# Patient Record
Sex: Female | Born: 1990 | Race: White | Hispanic: No | Marital: Married | Smoking: Never smoker
Health system: Southern US, Community
[De-identification: ages and names within clinical notes are randomized; demographics above are authoritative.]

## PROBLEM LIST (undated history)

## (undated) DIAGNOSIS — J45909 Unspecified asthma, uncomplicated: Secondary | ICD-10-CM

## (undated) DIAGNOSIS — F909 Attention-deficit hyperactivity disorder, unspecified type: Secondary | ICD-10-CM

---

## 2011-02-10 ENCOUNTER — Emergency Department: Payer: Self-pay | Admitting: Emergency Medicine

## 2012-01-22 ENCOUNTER — Emergency Department: Payer: Self-pay | Admitting: *Deleted

## 2012-05-23 ENCOUNTER — Emergency Department: Payer: Self-pay | Admitting: Internal Medicine

## 2012-05-30 ENCOUNTER — Emergency Department: Payer: Self-pay | Admitting: Emergency Medicine

## 2012-06-02 ENCOUNTER — Emergency Department: Payer: Self-pay | Admitting: Emergency Medicine

## 2012-09-17 ENCOUNTER — Emergency Department: Payer: Self-pay | Admitting: Emergency Medicine

## 2012-09-18 LAB — RAPID INFLUENZA A&B ANTIGENS

## 2012-09-20 LAB — BETA STREP CULTURE(ARMC)

## 2012-11-08 ENCOUNTER — Emergency Department: Payer: Self-pay | Admitting: Emergency Medicine

## 2012-11-21 ENCOUNTER — Observation Stay: Payer: Self-pay | Admitting: Obstetrics and Gynecology

## 2012-11-21 LAB — URINALYSIS, COMPLETE
Bilirubin,UR: NEGATIVE
Ketone: NEGATIVE
Nitrite: NEGATIVE
Ph: 7 (ref 4.5–8.0)
Protein: 100
RBC,UR: 2710 /HPF (ref 0–5)
Specific Gravity: 1.02 (ref 1.003–1.030)
Squamous Epithelial: 6

## 2012-11-22 LAB — WET PREP, GENITAL

## 2012-11-24 LAB — URINE CULTURE

## 2012-12-10 ENCOUNTER — Observation Stay: Payer: Self-pay | Admitting: Obstetrics and Gynecology

## 2012-12-10 LAB — CBC WITH DIFFERENTIAL/PLATELET
Basophil #: 0 10*3/uL (ref 0.0–0.1)
Basophil %: 0.3 %
Eosinophil #: 0.2 10*3/uL (ref 0.0–0.7)
Eosinophil %: 1.9 %
HGB: 9.4 g/dL — ABNORMAL LOW (ref 12.0–16.0)
Lymphocyte #: 1.9 10*3/uL (ref 1.0–3.6)
Lymphocyte %: 19.8 %
MCH: 25.3 pg — ABNORMAL LOW (ref 26.0–34.0)
Monocyte %: 8.2 %
Neutrophil #: 6.6 10*3/uL — ABNORMAL HIGH (ref 1.4–6.5)
Neutrophil %: 69.8 %
Platelet: 185 10*3/uL (ref 150–440)
RBC: 3.72 10*6/uL — ABNORMAL LOW (ref 3.80–5.20)
RDW: 13.4 % (ref 11.5–14.5)

## 2012-12-10 LAB — COMPREHENSIVE METABOLIC PANEL
Albumin: 2.4 g/dL — ABNORMAL LOW (ref 3.4–5.0)
Alkaline Phosphatase: 106 U/L (ref 50–136)
Anion Gap: 8 (ref 7–16)
Bilirubin,Total: 0.2 mg/dL (ref 0.2–1.0)
Chloride: 112 mmol/L — ABNORMAL HIGH (ref 98–107)
Co2: 21 mmol/L (ref 21–32)
Creatinine: 0.77 mg/dL (ref 0.60–1.30)
EGFR (African American): >60
Glucose: 85 mg/dL (ref 65–99)
Osmolality: 278 (ref 275–301)
Potassium: 3.4 mmol/L — ABNORMAL LOW (ref 3.5–5.1)
Sodium: 141 mmol/L (ref 136–145)
Total Protein: 6 g/dL — ABNORMAL LOW (ref 6.4–8.2)

## 2012-12-10 LAB — URINALYSIS, COMPLETE
Glucose,UR: NEGATIVE mg/dL (ref 0–75)
Ketone: NEGATIVE
Nitrite: NEGATIVE
Ph: 7 (ref 4.5–8.0)
RBC,UR: 1 /HPF (ref 0–5)
Specific Gravity: 1.02 (ref 1.003–1.030)
Squamous Epithelial: 27
WBC UR: 7 /HPF (ref 0–5)

## 2013-02-01 ENCOUNTER — Observation Stay: Payer: Self-pay | Admitting: Obstetrics and Gynecology

## 2013-02-01 LAB — URINALYSIS, COMPLETE
Bilirubin,UR: NEGATIVE
Blood: NEGATIVE
Glucose,UR: NEGATIVE mg/dL (ref 0–75)
Nitrite: NEGATIVE
Ph: 7 (ref 4.5–8.0)
Protein: NEGATIVE
RBC,UR: 1 /HPF (ref 0–5)
Specific Gravity: 1.009 (ref 1.003–1.030)

## 2013-02-19 ENCOUNTER — Observation Stay: Payer: Self-pay | Admitting: Obstetrics and Gynecology

## 2013-02-21 ENCOUNTER — Observation Stay: Payer: Self-pay

## 2013-02-22 ENCOUNTER — Inpatient Hospital Stay: Payer: Self-pay | Admitting: Obstetrics & Gynecology

## 2013-02-22 LAB — CBC WITH DIFFERENTIAL/PLATELET
Basophil #: 0.1 10*3/uL (ref 0.0–0.1)
Eosinophil #: 0.1 10*3/uL (ref 0.0–0.7)
HCT: 31.1 % — ABNORMAL LOW (ref 35.0–47.0)
HGB: 10.1 g/dL — ABNORMAL LOW (ref 12.0–16.0)
Lymphocyte %: 22 %
MCHC: 32.4 g/dL (ref 32.0–36.0)
MCV: 70 fL — ABNORMAL LOW (ref 80–100)
Monocyte #: 0.6 x10 3/mm (ref 0.2–0.9)
RBC: 4.45 10*6/uL (ref 3.80–5.20)
WBC: 8.9 10*3/uL (ref 3.6–11.0)

## 2013-02-22 LAB — GC/CHLAMYDIA PROBE AMP

## 2014-01-03 ENCOUNTER — Emergency Department: Payer: Self-pay | Admitting: Emergency Medicine

## 2014-01-03 LAB — CBC
HCT: 38.8 % (ref 35.0–47.0)
HGB: 12.5 g/dL (ref 12.0–16.0)
MCH: 23.8 pg — AB (ref 26.0–34.0)
MCHC: 32.1 g/dL (ref 32.0–36.0)
MCV: 74 fL — ABNORMAL LOW (ref 80–100)
Platelet: 265 10*3/uL (ref 150–440)
RBC: 5.24 10*6/uL — ABNORMAL HIGH (ref 3.80–5.20)
RDW: 16.7 % — AB (ref 11.5–14.5)
WBC: 13.1 10*3/uL — ABNORMAL HIGH (ref 3.6–11.0)

## 2014-01-03 LAB — COMPREHENSIVE METABOLIC PANEL
ALBUMIN: 3.6 g/dL (ref 3.4–5.0)
AST: 23 U/L (ref 15–37)
Alkaline Phosphatase: 88 U/L
Anion Gap: 8 (ref 7–16)
BUN: 13 mg/dL (ref 7–18)
Bilirubin,Total: 0.3 mg/dL (ref 0.2–1.0)
CHLORIDE: 109 mmol/L — AB (ref 98–107)
Calcium, Total: 9.1 mg/dL (ref 8.5–10.1)
Co2: 21 mmol/L (ref 21–32)
Creatinine: 0.63 mg/dL (ref 0.60–1.30)
EGFR (Non-African Amer.): >60
GLUCOSE: 84 mg/dL (ref 65–99)
Osmolality: 275 (ref 275–301)
Potassium: 3.6 mmol/L (ref 3.5–5.1)
SGPT (ALT): 23 U/L (ref 12–78)
Sodium: 138 mmol/L (ref 136–145)
TOTAL PROTEIN: 7.3 g/dL (ref 6.4–8.2)

## 2014-01-03 LAB — URINALYSIS, COMPLETE
BILIRUBIN, UR: NEGATIVE
Bacteria: NONE SEEN
GLUCOSE, UR: NEGATIVE mg/dL (ref 0–75)
Ketone: NEGATIVE
NITRITE: NEGATIVE
PH: 6 (ref 4.5–8.0)
SPECIFIC GRAVITY: 1.017 (ref 1.003–1.030)
Squamous Epithelial: 1
WBC UR: 426 /HPF (ref 0–5)

## 2014-01-03 LAB — HCG, QUANTITATIVE, PREGNANCY: BETA HCG, QUANT.: 11623 m[IU]/mL — AB

## 2014-01-04 LAB — WET PREP, GENITAL

## 2014-01-04 LAB — GC/CHLAMYDIA PROBE AMP

## 2014-01-06 LAB — URINE CULTURE

## 2014-02-03 ENCOUNTER — Emergency Department: Payer: Self-pay | Admitting: Emergency Medicine

## 2014-02-03 LAB — CBC
HCT: 36.5 % (ref 35.0–47.0)
HGB: 11.7 g/dL — ABNORMAL LOW (ref 12.0–16.0)
MCH: 24.2 pg — AB (ref 26.0–34.0)
MCHC: 32.1 g/dL (ref 32.0–36.0)
MCV: 75 fL — ABNORMAL LOW (ref 80–100)
Platelet: 214 10*3/uL (ref 150–440)
RBC: 4.84 10*6/uL (ref 3.80–5.20)
RDW: 15.7 % — AB (ref 11.5–14.5)
WBC: 9.4 10*3/uL (ref 3.6–11.0)

## 2014-02-03 LAB — URINALYSIS, COMPLETE
BACTERIA: NONE SEEN
BILIRUBIN, UR: NEGATIVE
Glucose,UR: NEGATIVE mg/dL (ref 0–75)
KETONE: NEGATIVE
Nitrite: NEGATIVE
PH: 6 (ref 4.5–8.0)
Protein: NEGATIVE
RBC,UR: 447 /HPF (ref 0–5)
Specific Gravity: 1.02 (ref 1.003–1.030)
Squamous Epithelial: 2
WBC UR: 3 /HPF (ref 0–5)

## 2014-02-03 LAB — COMPREHENSIVE METABOLIC PANEL
AST: 13 U/L — AB (ref 15–37)
Albumin: 3.2 g/dL — ABNORMAL LOW (ref 3.4–5.0)
Alkaline Phosphatase: 78 U/L
Anion Gap: 8 (ref 7–16)
BILIRUBIN TOTAL: 0.5 mg/dL (ref 0.2–1.0)
BUN: 8 mg/dL (ref 7–18)
CHLORIDE: 107 mmol/L (ref 98–107)
CO2: 21 mmol/L (ref 21–32)
Calcium, Total: 8.8 mg/dL (ref 8.5–10.1)
Creatinine: 0.61 mg/dL (ref 0.60–1.30)
EGFR (African American): >60
EGFR (Non-African Amer.): >60
Glucose: 116 mg/dL — ABNORMAL HIGH (ref 65–99)
Osmolality: 271 (ref 275–301)
POTASSIUM: 3.2 mmol/L — AB (ref 3.5–5.1)
SGPT (ALT): 18 U/L (ref 12–78)
Sodium: 136 mmol/L (ref 136–145)
Total Protein: 6.8 g/dL (ref 6.4–8.2)

## 2014-02-03 LAB — HCG, QUANTITATIVE, PREGNANCY: Beta Hcg, Quant.: 79715 m[IU]/mL — ABNORMAL HIGH

## 2014-02-05 LAB — URINE CULTURE

## 2014-05-13 ENCOUNTER — Observation Stay: Payer: Self-pay | Admitting: Obstetrics and Gynecology

## 2014-05-13 LAB — URINALYSIS, COMPLETE
Bacteria: NONE SEEN
Bilirubin,UR: NEGATIVE
GLUCOSE, UR: NEGATIVE mg/dL (ref 0–75)
NITRITE: NEGATIVE
PH: 6 (ref 4.5–8.0)
Specific Gravity: 1.019 (ref 1.003–1.030)

## 2014-08-18 HISTORY — PX: TUBAL LIGATION: SHX77

## 2014-08-30 ENCOUNTER — Inpatient Hospital Stay: Payer: Self-pay | Admitting: Obstetrics and Gynecology

## 2014-08-30 LAB — CBC WITH DIFFERENTIAL/PLATELET
Basophil #: 0.1 10*3/uL (ref 0.0–0.1)
Basophil %: 0.5 %
EOS PCT: 0.8 %
Eosinophil #: 0.1 10*3/uL (ref 0.0–0.7)
HCT: 31.4 % — ABNORMAL LOW (ref 35.0–47.0)
HGB: 9.9 g/dL — ABNORMAL LOW (ref 12.0–16.0)
Lymphocyte #: 1.8 10*3/uL (ref 1.0–3.6)
Lymphocyte %: 14.1 %
MCH: 21.5 pg — ABNORMAL LOW (ref 26.0–34.0)
MCHC: 31.4 g/dL — ABNORMAL LOW (ref 32.0–36.0)
MCV: 68 fL — ABNORMAL LOW (ref 80–100)
MONOS PCT: 7.1 %
Monocyte #: 0.9 x10 3/mm (ref 0.2–0.9)
NEUTROS PCT: 77.5 %
Neutrophil #: 10 10*3/uL — ABNORMAL HIGH (ref 1.4–6.5)
Platelet: 241 10*3/uL (ref 150–440)
RBC: 4.59 10*6/uL (ref 3.80–5.20)
RDW: 16.3 % — ABNORMAL HIGH (ref 11.5–14.5)
WBC: 12.9 10*3/uL — ABNORMAL HIGH (ref 3.6–11.0)

## 2014-08-30 LAB — HEMATOCRIT: HCT: 30.4 % — ABNORMAL LOW (ref 35.0–47.0)

## 2014-08-31 LAB — GC/CHLAMYDIA PROBE AMP

## 2014-12-17 NOTE — Op Note (Signed)
PATIENT NAME:  Virginia [name], Virginia [name] MR#:  [id number] DATE OF BIRTH:  [birth year]  DATE OF PROCEDURE:  08/31/2014  PREOPERATIVE DIAGNOSES:  1.  Multiparity.  2.  Postpartum, desiring permanent sterilization.   POSTOPERATIVE DIAGNOSES: 1.  Multiparity. 2.  Postpartum, desiring permanent sterilization.  OPERATION: Bilateral tubal ligation.   SURGEON: Hildred LaserAnika Elisia Stepp, MD  ANESTHESIA: General.  ESTIMATED BLOOD LOSS: Minimal.   INTRAVENOUS FLUIDS: 700 mL.   URINE OUTPUT: None. The patient voided prior to the procedure.   COMPLICATIONS: None.   FINDINGS: Uterine fundus was palpated at the level of the umbilicus. Finding also included [name] normal-appearing uterine outline, tubes and ovaries.   SPECIMENS: None.   CONDITION: Stable.   PROCEDURE: The patient was taken to the operating room where she was placed under general anesthesia without difficulty. She was then prepped and draped in normal sterile fashion and placed in the supine position. [name] timeout proceeded to confirm patient identity and procedure was performed. [name] small transverse infraumbilical skin incision was then made with [name] scalpel. The incision was carried down through the underlying fascia until the peritoneum was identified and entered. The peritoneum was noted to be free of any adhesions and the incision was then extended bluntly. The patient's left fallopian tube was then identified, brought to the incision and grasped with [name] Babcock clamp. The tube was then followed all the way out to the fimbriated end. The Babcock clamp was then used to grasp the tube approximately 4 cm from the cornual region. [name] Filshie clip was then applied to the segment of the fallopian tube and again hemostasis was noted and the tube was returned to the abdomen. The right fallopian tube was then brought to the incision, grasped with Babcock clamp and also followed up to the fimbriated. The Tanja PortBabcock was then used to grasp the tube approximately 4 cm from the cornual  region. At this segment of the tube, [name] Filshie clip was applied. The fallopian tube was returned to the abdomen. The fascia was then closed using [name] 0 Vicryl in [name] running fashion. The skin was then closed using [name] subcuticular stitch of 4-0 Vicryl. Instrument and sponge and needle counts were correct x 2 at the end of the procedure. The patient tolerated the procedure well. She was transferred to the PACU in stable condition.    ____________________________ Jacques EarthlyAnika S. Valentino Saxonherry, MD asc:TT D: 08/31/2014 14:20:30 ET T: 08/31/2014 20:37:42 ET JOB#: [id number]  cc: Jacques EarthlyAnika S. Valentino Saxonherry, MD, <Dictator> Fabian NovemberANIKA S Eshaan Titzer MD ELECTRONICALLY SIGNED 09/11/2014 8:06

## 2014-12-26 NOTE — H&P (Signed)
L&D Evaluation:  History:  HPI 24 yo G2P1001 at [facility]red[facility] with right flank pain over the past week.  Initially mild, reported at her visit on 12/10/2011, approximately 3 days duration and worsening.  She was started on macrobid at that time for possible UTI, UA noteable for trace leukocytes and 2+ glucose at that time (1-h glucola).  Culture sent but pending.  She did not nor does she now report any dysuria.  She states the pain is constant with shrap episodic worsening, raps around her right flank and occasional radiation into her groin.   She has had some associated nausea today, no fevers, no chills. Of note she has a similar episode of pain on 11/22/2012  Her pregnancy thus far has been uncomplicated. Although her inital BP last visit was slightly elvated 140/80 repeat 138/82.  A+ / RI / VZI / HBsAg neg / HIV neg / PAP wnl / TETRA neg / GC & CT neg / 1-hr 116.  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  HEENT, CNS, GI, GU, Respiratory, CV, Renal and Musculoskeletal systems were found to be normal.   Exam:  Vital Signs stable   Urine Protein trace   General no apparent distress   Mental Status clear   Chest clear   Heart normal sinus rhythm   Abdomen gravid, non-tender   Estimated Fetal Weight Average for gestational age   Back CVAT, Righ CVA/flank pain   Edema no edema   FHT normal rate with no decels, reactive by <32 week criteria   Ucx absent   Impression:  Impression G2P1001 at [redacted] weeks gestation with likely renal calculi   Plan:  Comments 1) Flank pain - likely secondary to  neprholithiasis/passing renal calculi given blood on UA and calcium oxalate crystals.  Will check CMP, CBC, and lipase to evaluate renal function, rule out infectious etiology such as pylonephritis, or pancreatitis given accompanying nausea.        - strain urine      - IV hyrdration LR 15550mL/hr      - cover with ceftriaxone 1g IV q24hrs until urine culture results       - Renal US in AM      - morphine PCA      - Zofran 4mg  IV q4hrs prn nausea      - benadryl prn pruritus  2) Fetus - RFS, category I tracing  3) FEN - general, LR 12850mL/HR  4) Disposition - pending results of renal US and clinical improvement   Electronic Signatures for Addendum Section:  Lorrene ReidStaebler, Treyvin Glidden M (MD) (Signed Addendum 25-Apr-14 23:09)  cbc, bmp, lipase reviewed essentially normal other than mild hypokalemia and anemia   Electronic Signatures: Lorrene ReidStaebler, Tena Linebaugh M (MD)  (Signed 25-Apr-14 22:02)  Authored: L&D Evaluation   Last Updated: 25-Apr-14 23:09 by Lorrene ReidStaebler, Lucillia Corson M (MD)

## 2014-12-26 NOTE — H&P (Signed)
L&D Evaluation:  History:  HPI 24 yo G2P1001 at 6729w2d gestational age by LMP consistent with early ultrasound.  Her pregnancy has been complicated by asthma, as well as a recent treatment with antibiotics for tick bite.  She presents with cramping in her lower abdomen and right flank pain. Onset was this morning .  Nothing makes worse or better.  notes positive fetal movement, denies vaginal bleeding, leakage of fluid, and discreet contractions. Denies dysuria, frequency, fevers, chills, nausea, vomiting, diarrhea, constipation.   A positive / VZI / HBsAg / RI / GBS unk   Patient's Medical History Asthma   Patient's Surgical History none   Medications Pre Natal Vitamins  combo inhaled corticosteroid, beta agonist   Allergies environmental   Social History none   Family History Non-Contributory   ROS:  ROS All systems were reviewed.  HEENT, CNS, GI, GU, Respiratory, CV, Renal and Musculoskeletal systems were found to be normal., x 10 systems reviewed unless noted in HPI   Exam:  Vital Signs stable   Urine Protein 1+   General no apparent distress   Mental Status clear   Chest clear   Heart normal sinus rhythm   Abdomen gravid, non-tender   Estimated Fetal Weight Average for gestational age   Back no CVAT, mild tenderness just superior to right iliac crest laterally   Edema no edema   Pelvic no external lesions, cervix closed and thick   Mebranes Intact   FHT normal rate with no decels   FHT Description 135 / mod var/ + 10x10 accels / no decels   Ucx absent   Skin no lesions   Impression:  Impression Right flank pain (? nephroureterolithiasis), braxton hicks contractions.   Plan:  Plan UA   Comments - Urine culture - fFN pending - Wet prep NEGATIVE - no GC/CT collection swabs available on unit during exam.   Follow Up Appointment need to schedule. within the week   Labs:  Lab Results:  Routine Micro:  06-Apr-14 22:32   Comment 2. NO  TRICHOMONAS,SPERMATOZOA,YEAST,OR CLUE CELLS SEEN  Result(s) reported on 22 Nov 2012 at 12:51AM.  Routine UA:  06-Apr-14 22:32   Color (UA) Yellow  Clarity (UA) Cloudy  Glucose (UA) Negative  Bilirubin (UA) Negative  Ketones (UA) Negative  Specific Gravity (UA) 1.020  Blood (UA) 3+  pH (UA) 7.0  Protein (UA) 100 mg/dL  Nitrite (UA) Negative  Leukocyte Esterase (UA) Trace (Result(s) reported on 21 Nov 2012 at 10:47PM.)  RBC (UA) 2710 /HPF  WBC (UA) 32 /HPF  Bacteria (UA) TRACE  Epithelial Cells (UA) 6 /HPF (Result(s) reported on 21 Nov 2012 at 10:47PM.)   Electronic Signatures: Conard NovakJackson, Rea Kalama D (MD)  (Signed 07-Apr-14 01:10)  Authored: L&D Evaluation, Labs   Last Updated: 07-Apr-14 01:10 by Conard NovakJackson, Danial Hlavac D (MD)

## 2014-12-26 NOTE — H&P (Signed)
L&D Evaluation:  History:  HPI [name] is a 24 y.o. G3P2002 married Caucasian female, LMP unure, EDD 08/24/14, EGA 39.1 weeks who presents with c/o worsening contractions since 0600 this morning. Denies LOF, vaginal bleeding, decreased fetal movement.   Presents with contractions   Patient's Medical History Asthma  SVD x 2 at ~ [redacted] weeks gestation.  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  Second pregnancy with GDM (diet controlled). Papular dermatitis of pregnancyH/o scabies this pregnancy   Patient's Surgical History none   Medications Pre Natal Vitamins  Albuterol prn   Allergies NKDA   Social History none   Family History Non-Contributory   ROS:  General normal   HEENT normal   CNS normal   GI normal   GU contractions   Resp normal   CV normal   Renal normal   MS normal   Exam:  Vital Signs stable   Urine Protein not completed   General no apparent distress   Mental Status clear   Chest clear   Heart normal sinus rhythm   Abdomen gravid, non-tender   Estimated Fetal Weight Average for gestational age   Fetal Position cephalic   Back no CVAT   Edema no edema   Pelvic no external lesions, 5/70/ballotable   Mebranes Intact   FHT normal rate with no decels   Fetal Heart Rate 120   Ucx irregular   Ucx Frequency 5 min   Skin dry, no lesions   Lymph no lymphadenopathy   Other A+/-/ND/NR/RI/VZI/GBS+/GC-/Cl-.  Elevated early RGS (141), repeat at 28 weeks normal.  Normal AFP tetra. 09/02/14: 12.9>9.9/31.4<241   Impression:  Impression active labor   Plan:  Plan EFM/NST, monitor contractions and for cervical change, antibiotics for GBBS prophylaxis   Comments Admit to Labor and Delivery Epidural desired, received.  Anticipate vaginal delivery.   Electronic Signatures: Fabian Novemberherry, Samie Reasons S (MD)  (Signed 13-Jan-16 12:55)  Authored: L&D Evaluation   Last Updated: 13-Jan-16 12:55 by Fabian Novemberherry, Aislynn Cifelli S (MD)

## 2014-12-26 NOTE — H&P (Signed)
L&D Evaluation:  History Expanded:  HPI 24 yo GP1 at 39 weeks who has been contracting every 5 minutes and presnts to l and d. pt is Virginia [name] Previous ghild with meningitis PP,   Gravida 2   Term 1   PreTerm 00   Abortion 0   Living 1   Blood Type (Maternal) A positive   Group B Strep Results Maternal (Result >5wks must be treated as unknown) unknown/result > 5 weeks ago   Maternal HIV Negative   Maternal Syphilis Ab Nonreactive   Maternal Varicella Immune   Rubella Results (Maternal) immune   Maternal T-Dap Immune   [facility]   Presents with abdominal pain, contractions   Patient's Medical History Asthma   Patient's Surgical History none   Medications Pre Natal Vitamins   Allergies NKDA   Social History none   Family History Non-Contributory   ROS:  ROS All systems were reviewed.  HEENT, CNS, GI, GU, Respiratory, CV, Renal and Musculoskeletal systems were found to be normal.   Exam:  Vital Signs stable   Urine Protein not completed   General no apparent distress   Mental Status clear   Chest clear   Heart normal sinus rhythm   Abdomen gravid, non-tender   Estimated Fetal Weight Small for gestational age   Fetal Position v   Back no CVAT   Edema no edema   Pelvic no external lesions   Mebranes Intact   FHT normal rate with no decels   FHT Description CAT 1 strictly reactive   Fetal Heart Rate 140   Ucx irregular   Skin dry   Impression:  Impression early labor   Plan:  Comments off er morphine shot to sleep dc home latent labor,   Follow Up Appointment already scheduled   Electronic Signatures for Addendum Section:  Adria DevonKlett, Countess Biebel (MD) (Signed Addendum 05-Jul-14 14:31)  pt is GBS negative   Electronic Signatures: Adria DevonKlett, Oline Belk (MD)  (Signed 05-Jul-14 14:20)  Authored: L&D Evaluation   Last Updated: 05-Jul-14 14:31 by Adria DevonKlett, Keston Seever (MD)

## 2014-12-26 NOTE — H&P (Signed)
L&D Evaluation:  History Expanded:  HPI 24 yo G2P1 at 39.3 weeks, EDD of 02/26/13 per LMP and early US. Presents in active labor, dilated 5 cm 1 hr ago per RN check[facility] H/o prior child who was re-admitted at 701 month of age for viral meningitis.   Gravida 2   Term 1   PreTerm 0   Abortion 0   Living 1   Blood Type (Maternal) A positive   Group B Strep Results Maternal (Result >5wks must be treated as unknown) negative  unknown/result > 5 weeks ago   Maternal HIV Negative   Maternal Syphilis Ab Nonreactive   Maternal Varicella Immune   Rubella Results (Maternal) immune   Maternal T-Dap Immune   [facility]   Presents with abdominal pain, contractions   Patient's Medical History Asthma   Patient's Surgical History none   Medications Pre Natal Vitamins   Allergies NKDA   Social History none   Family History Non-Contributory   ROS:  ROS All systems were reviewed.  HEENT, CNS, GI, GU, Respiratory, CV, Renal and Musculoskeletal systems were found to be normal.   Exam:  Vital Signs stable   Urine Protein not completed   General no apparent distress   Mental Status clear   Chest clear   Heart no murmur/gallop/rubs   Abdomen gravid, tender with contractions   Estimated Fetal Weight Average for gestational age   Edema no edema   Pelvic no external lesions, 7/100/-2   Mebranes Intact   FHT normal rate with no decels   FHT Description category 1 before Stadol, minimal variability after   Ucx regular, q 2-4 min   Skin dry   Impression:  Impression active labor   Plan:  Comments Epidural pending Will tx with Abx per prior discussion with pt d/t h/o infant with meningitis. Pt in agreement.   Electronic Signatures: Vella KohlerBrothers, Alysia Scism K (CNM)  (Signed 08-Jul-14 05:04)  Authored: L&D Evaluation   Last Updated: 08-Jul-14 05:04 by Vella KohlerBrothers, Jerimie Mancuso K (CNM)

## 2014-12-26 NOTE — H&P (Signed)
L&D Evaluation:  History Expanded:  HPI G2P1 24 yo whoi was at work and stood up and had atrickle of fluid into her unbderwear. she did not have mnore after tha. she also had some increased discharge and was concerned and canme in thinking she may have SROM. She is 36 w 2 d she had a previous child with GBS meningitis. she is not contracting and she has not had any bleeding.   Gravida 2   Term 1   PreTerm 0   Abortion 0   Living 1   Blood Type (Maternal) A positive   Group B Strep Results Maternal (Result >5wks must be treated as unknown) unknown/result > 5 weeks ago   Maternal HIV Negative   Maternal Syphilis Ab Nonreactive   Maternal Varicella Immune   Rubella Results (Maternal) immune   Maternal T-Dap Unknown   [facility]   Presents with vaginal discharge   Patient's Medical History No Chronic Illness   Patient's Surgical History none   Medications Pre Natal Vitamins   Allergies NKDA   Social History none   Family History Non-Contributory   ROS:  ROS All systems were reviewed.  HEENT, CNS, GI, GU, Respiratory, CV, Renal and Musculoskeletal systems were found to be normal.   Exam:  Vital Signs stable   Urine Protein negative dipstick   General no apparent distress   Mental Status clear   Chest clear   Heart normal sinus rhythm   Abdomen gravid, non-tender   Back no CVAT   Edema no edema   Reflexes 1+   Pelvic no external lesions   Mebranes Intact   FHT normal rate with no decels, 140   FHT Description cat 1 strictly reactive   Fetal Heart Rate 140   Ucx absent   Skin dry   Impression:  Impression UTI   Plan:  Plan UA   Comments ROM plus as ph was neg for niutrazone and pos on the pad   Follow Up Appointment already scheduled   Electronic Signatures: Adria DevonKlett, Kylina Vultaggio (MD)  (Signed 17-Jun-14 20:47)  Authored: L&D Evaluation   Last Updated: 17-Jun-14 20:47 by Adria DevonKlett, Ameli Sangiovanni (MD)

## 2015-01-02 ENCOUNTER — Emergency Department
Admission: EM | Admit: 2015-01-02 | Discharge: 2015-01-02 | Disposition: A | Discharge status: Home / Self Care | Payer: Self-pay | Attending: Emergency Medicine | Admitting: Emergency Medicine

## 2015-01-02 ENCOUNTER — Encounter: Payer: Self-pay | Admitting: Emergency Medicine

## 2015-01-02 DIAGNOSIS — Z3202 Encounter for pregnancy test, result negative: Secondary | ICD-10-CM | POA: Insufficient documentation

## 2015-01-02 DIAGNOSIS — J069 Acute upper respiratory infection, unspecified: Secondary | ICD-10-CM | POA: Insufficient documentation

## 2015-01-02 DIAGNOSIS — R300 Dysuria: Secondary | ICD-10-CM | POA: Insufficient documentation

## 2015-01-02 HISTORY — DX: Unspecified asthma, uncomplicated: J45.909

## 2015-01-02 LAB — URINALYSIS COMPLETE WITH MICROSCOPIC (ARMC ONLY)
BACTERIA UA: NONE SEEN
BILIRUBIN URINE: NEGATIVE
Glucose, UA: NEGATIVE mg/dL
Hgb urine dipstick: NEGATIVE
KETONES UR: NEGATIVE mg/dL
Leukocytes, UA: NEGATIVE
Nitrite: NEGATIVE
PH: 6 (ref 5.0–8.0)
PROTEIN: 30 mg/dL — AB
Specific Gravity, Urine: 1.021 (ref 1.005–1.030)

## 2015-01-02 LAB — POCT PREGNANCY, URINE: Preg Test, Ur: NEGATIVE

## 2015-01-02 MED ORDER — GUAIFENESIN-CODEINE 100-10 MG/5ML PO SOLN: 10.0000 mL | ORAL | Status: DC | PRN

## 2015-01-02 MED ORDER — IBUPROFEN 800 MG PO TABS: 800.0000 mg | ORAL_TABLET | Freq: Three times a day (TID) | ORAL | Status: DC | PRN

## 2015-01-02 MED ORDER — AMOXICILLIN 500 MG PO TABS: 500.0000 mg | ORAL_TABLET | Freq: Three times a day (TID) | ORAL | Status: DC

## 2015-01-02 NOTE — ED Provider Notes (Signed)
[facility] Emergency Department Provider Note  ____________________________________________  Time seen: Approximately 10:23 AM  I have reviewed the triage vital signs and the nursing notes.   HISTORY  Chief Complaint Urinary Tract Infection and Chills    HPI Virginia [name] is a 24 y.o. female presented to ER with complaints of runny nose congestion and sore throat drainage and urinary tract infection symptoms. Symptoms 5 days. UTI symptoms 2 days. Has been taking AZO with some relief. States her cough is productive greenish sputum home denies any fever chills nausea vomiting. Complains of losing her voice this morning.   Past Medical History  Diagnosis Date  . Asthma     There are no active problems to display for this patient.   No past surgical history on file.  Current Outpatient Rx  Name  Route  Sig  Dispense  Refill  . amoxicillin (AMOXIL) 500 MG tablet   Oral   Take 1 tablet (500 mg total) by mouth 3 (three) times daily.   30 tablet   0   . guaiFENesin-codeine 100-10 MG/5ML syrup   Oral   Take 10 mLs by mouth every 4 (four) hours as needed for cough.   180 mL   0   . ibuprofen (ADVIL,MOTRIN) 800 MG tablet   Oral   Take 1 tablet (800 mg total) by mouth every 8 (eight) hours as needed.   30 tablet   0     Allergies Review of patient's allergies indicates no known allergies.  No family history on file.  Social History History  Substance Use Topics  . Smoking status: Never Smoker   . Smokeless tobacco: Not on file  . Alcohol Use: No    Review of Systems Constitutional: No fever/chills Eyes: No visual changes. ENT: Minimal sore throat. Positive hoarseness Cardiovascular: Denies chest pain. Respiratory: Denies shortness of breath. Positive cough Gastrointestinal: Minimal abdominal pain.  No nausea, no vomiting.  No diarrhea.  No constipation. Genitourinary: Positive for dysuria. Musculoskeletal: Negative for back  pain. Skin: Negative for rash. Neurological: Negative for headaches, focal weakness or numbness.  10-point ROS otherwise negative.  ____________________________________________   PHYSICAL EXAM:  VITAL SIGNS: ED Triage Vitals  Enc Vitals Group     BP 01/02/15 0931 97/80 mmHg     Pulse Rate 01/02/15 0931 104     Resp 01/02/15 0931 14     Temp 01/02/15 0931 97.6 F (36.4 C)     Temp Source 01/02/15 0931 Oral     SpO2 01/02/15 0931 96 %     Weight 01/02/15 0931 150 lb (68.04 kg)     Height 01/02/15 0931 5\' 4"  (1.626 m)     Head Cir --      Peak Flow --      Pain Score 01/02/15 0932 6     Pain Loc --      Pain Edu? --      Excl. in GC? --     Constitutional: Alert and oriented. Well appearing and in no acute distress. Head: Atraumatic. Nose: Minimal with turbinate swelling anterior, positive congestion/rhinnorhea. Mouth/Throat: Mucous membranes are moist.  Oropharynx non-erythematous. Neck: No stridor.  Hematological/Lymphatic/Immunilogical: No cervical lymphadenopathy. Cardiovascular: Normal rate, regular rhythm. Grossly normal heart sounds.  Good peripheral circulation. Respiratory: Normal respiratory effort.  No retractions. Lungs CTAB. Gastrointestinal: Soft and nontender. No distention. No abdominal bruits. No CVA tenderness. Genitourinary: Mild suprapubic tenderness Musculoskeletal: No lower extremity tenderness nor edema.  No joint effusions. Neurologic:  Normal speech and language. No gross focal neurologic deficits are appreciated. Speech is normal. No gait instability. Skin:  Skin is warm, dry and intact. No rash noted. Psychiatric: Mood and affect are normal. Speech and behavior are normal.  ____________________________________________   LABS (all labs ordered are listed, but only abnormal results are displayed)  Labs Reviewed  URINALYSIS COMPLETEWITH MICROSCOPIC ([facility])  - Abnormal; Notable for the following:    Color, Urine YELLOW (*)    APPearance  CLEAR (*)    Protein, ur 30 (*)    Squamous Epithelial / LPF 6-30 (*)    All other components within normal limits  POC URINE PREG, ED  POCT PREGNANCY, URINE   ____________________________________________  EKG  Not applicable ____________________________________________  RADIOLOGY  None ____________________________________________   PROCEDURES  Procedure(s) performed: None  Critical Care performed: No  ____________________________________________   INITIAL IMPRESSION / ASSESSMENT AND PLAN / ED COURSE  Pertinent labs & imaging results that were available during my care of the patient were reviewed by me and considered in my medical decision making (see chart for details).  Discussed all clinical findings with patient and reviewed plan and course of action. Patient understands and voices no other EMC at this point. She is follow-up with PCP or return as directed ____________________________________________   FINAL CLINICAL IMPRESSION(S) / ED DIAGNOSES  Final diagnoses:  URI, acute  Dysuria      Evangeline DakinCharles M Joely Losier, PA-C 01/02/15 1126  Darien Ramusavid W Kaminski, MD 01/02/15 [phone]

## 2015-01-02 NOTE — Discharge Instructions (Signed)
Dysuria Dysuria is the medical term for pain with urination. There are many causes for dysuria, but urinary tract infection is the most common. If a urinalysis was performed it can show that there is a urinary tract infection. A urine culture confirms that you or your child is sick. You will need to follow up with a healthcare provider because:  If a urine culture was done you will need to know the culture results and treatment recommendations.  If the urine culture was positive, you or your child will need to be put on antibiotics or know if the antibiotics prescribed are the right antibiotics for your urinary tract infection.  If the urine culture is negative (no urinary tract infection), then other causes may need to be explored or antibiotics need to be stopped. Today laboratory work may have been done and there does not seem to be an infection. If cultures were done they will take at least 24 to 48 hours to be completed. Today x-rays may have been taken and they read as normal. No cause can be found for the problems. The x-rays may be re-read by a radiologist and you will be contacted if additional findings are made. You or your child may have been put on medications to help with this problem until you can see your primary caregiver. If the problems get better, see your primary caregiver if the problems return. If you were given antibiotics (medications which kill germs), take all of the mediations as directed for the full course of treatment.  If laboratory work was done, you need to find the results. Leave a telephone number where you can be reached. If this is not possible, make sure you find out how you are to get test results. HOME CARE INSTRUCTIONS   Drink lots of fluids. For adults, drink eight, 8 ounce glasses of clear juice or water a day. For children, replace fluids as suggested by your caregiver.  Empty the bladder often. Avoid holding urine for long periods of time.  After a bowel  movement, women should cleanse front to back, using each tissue only once.  Empty your bladder before and after sexual intercourse.  Take all the medicine given to you until it is gone. You may feel better in a few days, but TAKE ALL MEDICINE.  Avoid caffeine, tea, alcohol and carbonated beverages, because they tend to irritate the bladder.  In men, alcohol may irritate the prostate.  Only take over-the-counter or prescription medicines for pain, discomfort, or fever as directed by your caregiver.  If your caregiver has given you a follow-up appointment, it is very important to keep that appointment. Not keeping the appointment could result in a chronic or permanent injury, pain, and disability. If there is any problem keeping the appointment, you must call back to this facility for assistance. SEEK IMMEDIATE MEDICAL CARE IF:   Back pain develops.  A fever develops.  There is nausea (feeling sick to your stomach) or vomiting (throwing up).  Problems are no better with medications or are getting worse. MAKE SURE YOU:   Understand these instructions.  Will watch your condition.  Will get help right away if you are not doing well or get worse. Document Released: 05/02/2004 Document Revised: 10/27/2011 Document Reviewed: 03/09/2008 [facility] Patient Information 2015 [place], [place]. This information is not intended to replace advice given to you by your health care provider. Make sure you discuss any questions you have with your health care provider.  Upper Respiratory Infection,  Adult An upper respiratory infection (URI) is also sometimes known as the common cold. The upper respiratory tract includes the nose, sinuses, throat, trachea, and bronchi. Bronchi are the airways leading to the lungs. Most people improve within 1 week, but symptoms can last up to 2 weeks. A residual cough may last even longer.  CAUSES Many different viruses can infect the tissues lining the upper respiratory  tract. The tissues become irritated and inflamed and often become very moist. Mucus production is also common. A cold is contagious. You can easily spread the virus to others by oral contact. This includes kissing, sharing a glass, coughing, or sneezing. Touching your mouth or nose and then touching a surface, which is then touched by another person, can also spread the virus. SYMPTOMS  Symptoms typically develop 1 to 3 days after you come in contact with a cold virus. Symptoms vary from person to person. They may include:  Runny nose.  Sneezing.  Nasal congestion.  Sinus irritation.  Sore throat.  Loss of voice (laryngitis).  Cough.  Fatigue.  Muscle aches.  Loss of appetite.  Headache.  Low-grade fever. DIAGNOSIS  You might diagnose your own cold based on familiar symptoms, since most people get a cold 2 to 3 times a year. Your caregiver can confirm this based on your exam. Most importantly, your caregiver can check that your symptoms are not due to another disease such as strep throat, sinusitis, pneumonia, asthma, or epiglottitis. Blood tests, throat tests, and X-rays are not necessary to diagnose a common cold, but they may sometimes be helpful in excluding other more serious diseases. Your caregiver will decide if any further tests are required. RISKS AND COMPLICATIONS  You may be at risk for a more severe case of the common cold if you smoke cigarettes, have chronic heart disease (such as heart failure) or lung disease (such as asthma), or if you have a weakened immune system. The very young and very old are also at risk for more serious infections. Bacterial sinusitis, middle ear infections, and bacterial pneumonia can complicate the common cold. The common cold can worsen asthma and chronic obstructive pulmonary disease (COPD). Sometimes, these complications can require emergency medical care and may be life-threatening. PREVENTION  The best way to protect against getting a  cold is to practice good hygiene. Avoid oral or hand contact with people with cold symptoms. Wash your hands often if contact occurs. There is no clear evidence that vitamin C, vitamin E, echinacea, or exercise reduces the chance of developing a cold. However, it is always recommended to get plenty of rest and practice good nutrition. TREATMENT  Treatment is directed at relieving symptoms. There is no cure. Antibiotics are not effective, because the infection is caused by a virus, not by bacteria. Treatment may include:  Increased fluid intake. Sports drinks offer valuable electrolytes, sugars, and fluids.  Breathing heated mist or steam (vaporizer or shower).  Eating chicken soup or other clear broths, and maintaining good nutrition.  Getting plenty of rest.  Using gargles or lozenges for comfort.  Controlling fevers with ibuprofen or acetaminophen as directed by your caregiver.  Increasing usage of your inhaler if you have asthma. Zinc gel and zinc lozenges, taken in the first 24 hours of the common cold, can shorten the duration and lessen the severity of symptoms. Pain medicines may help with fever, muscle aches, and throat pain. A variety of non-prescription medicines are available to treat congestion and runny nose. Your caregiver can make  recommendations and may suggest nasal or lung inhalers for other symptoms.  HOME CARE INSTRUCTIONS   Only take over-the-counter or prescription medicines for pain, discomfort, or fever as directed by your caregiver.  Use a warm mist humidifier or inhale steam from a shower to increase air moisture. This may keep secretions moist and make it easier to breathe.  Drink enough water and fluids to keep your urine clear or pale yellow.  Rest as needed.  Return to work when your temperature has returned to normal or as your caregiver advises. You may need to stay home longer to avoid infecting others. You can also use a face mask and careful hand washing  to prevent spread of the virus. SEEK MEDICAL CARE IF:   After the first few days, you feel you are getting worse rather than better.  You need your caregiver's advice about medicines to control symptoms.  You develop chills, worsening shortness of breath, or brown or red sputum. These may be signs of pneumonia.  You develop yellow or brown nasal discharge or pain in the face, especially when you bend forward. These may be signs of sinusitis.  You develop a fever, swollen neck glands, pain with swallowing, or white areas in the back of your throat. These may be signs of strep throat. SEEK IMMEDIATE MEDICAL CARE IF:   You have a fever.  You develop severe or persistent headache, ear pain, sinus pain, or chest pain.  You develop wheezing, a prolonged cough, cough up blood, or have a change in your usual mucus (if you have chronic lung disease).  You develop sore muscles or a stiff neck. Document Released: 01/28/2001 Document Revised: 10/27/2011 Document Reviewed: 11/09/2013 [facility] Patient Information 2015 [place], [place]. This information is not intended to replace advice given to you by your health care provider. Make sure you discuss any questions you have with your health care provider.

## 2015-01-02 NOTE — ED Notes (Signed)
Says uti sx.  Also cold/flu sx and lost her voice

## 2015-11-21 ENCOUNTER — Encounter: Payer: Self-pay | Admitting: Emergency Medicine

## 2015-11-21 ENCOUNTER — Emergency Department: Payer: Self-pay

## 2015-11-21 DIAGNOSIS — Z792 Long term (current) use of antibiotics: Secondary | ICD-10-CM | POA: Insufficient documentation

## 2015-11-21 DIAGNOSIS — M940 Chondrocostal junction syndrome [Tietze]: Secondary | ICD-10-CM | POA: Insufficient documentation

## 2015-11-21 DIAGNOSIS — F909 Attention-deficit hyperactivity disorder, unspecified type: Secondary | ICD-10-CM | POA: Insufficient documentation

## 2015-11-21 DIAGNOSIS — Z79899 Other long term (current) drug therapy: Secondary | ICD-10-CM | POA: Insufficient documentation

## 2015-11-21 DIAGNOSIS — J45909 Unspecified asthma, uncomplicated: Secondary | ICD-10-CM | POA: Insufficient documentation

## 2015-11-21 DIAGNOSIS — Z791 Long term (current) use of non-steroidal anti-inflammatories (NSAID): Secondary | ICD-10-CM | POA: Insufficient documentation

## 2015-11-21 LAB — URINALYSIS COMPLETE WITH MICROSCOPIC (ARMC ONLY)
Bacteria, UA: NONE SEEN
Bilirubin Urine: NEGATIVE
GLUCOSE, UA: NEGATIVE mg/dL
HGB URINE DIPSTICK: NEGATIVE
Ketones, ur: NEGATIVE mg/dL
Leukocytes, UA: NEGATIVE
Nitrite: NEGATIVE
Protein, ur: NEGATIVE mg/dL
SPECIFIC GRAVITY, URINE: 1.023 (ref 1.005–1.030)
WBC, UA: NONE SEEN WBC/hpf (ref 0–5)
pH: 6 (ref 5.0–8.0)

## 2015-11-21 LAB — CBC
HEMATOCRIT: 35.1 % (ref 35.0–47.0)
HEMOGLOBIN: 11.3 g/dL — AB (ref 12.0–16.0)
MCH: 22.5 pg — AB (ref 26.0–34.0)
MCHC: 32.3 g/dL (ref 32.0–36.0)
MCV: 69.8 fL — AB (ref 80.0–100.0)
Platelets: 256 10*3/uL (ref 150–440)
RBC: 5.02 MIL/uL (ref 3.80–5.20)
RDW: 15.4 % — ABNORMAL HIGH (ref 11.5–14.5)
WBC: 8.5 10*3/uL (ref 3.6–11.0)

## 2015-11-21 NOTE — ED Notes (Signed)
Pt ambulatory to triage c/o aching pressure chest pain in upper left with radiation to left shoulder, neg hx of cardiac, pt reports dizziness and SOB (pain sharp on deep inhalation), pain worse with palpation.  Pt reports hx of asthma and ADHD, and tubal ligation s/p 3 kids.

## 2015-11-22 ENCOUNTER — Emergency Department
Admission: EM | Admit: 2015-11-22 | Discharge: 2015-11-22 | Disposition: A | Discharge status: Home / Self Care | Payer: Self-pay | Attending: Emergency Medicine | Admitting: Emergency Medicine

## 2015-11-22 DIAGNOSIS — M940 Chondrocostal junction syndrome [Tietze]: Secondary | ICD-10-CM

## 2015-11-22 HISTORY — DX: Attention-deficit hyperactivity disorder, unspecified type: F90.9

## 2015-11-22 LAB — COMPREHENSIVE METABOLIC PANEL
ALK PHOS: 92 U/L (ref 38–126)
ALT: 21 U/L (ref 14–54)
AST: 23 U/L (ref 15–41)
Albumin: 3.9 g/dL (ref 3.5–5.0)
Anion gap: 7 (ref 5–15)
BILIRUBIN TOTAL: 0.3 mg/dL (ref 0.3–1.2)
BUN: 21 mg/dL — ABNORMAL HIGH (ref 6–20)
CO2: 21 mmol/L — ABNORMAL LOW (ref 22–32)
CREATININE: 0.69 mg/dL (ref 0.44–1.00)
Calcium: 9.5 mg/dL (ref 8.9–10.3)
Chloride: 108 mmol/L (ref 101–111)
Glucose, Bld: 113 mg/dL — ABNORMAL HIGH (ref 65–99)
Potassium: 3.3 mmol/L — ABNORMAL LOW (ref 3.5–5.1)
Sodium: 136 mmol/L (ref 135–145)
Total Protein: 7.1 g/dL (ref 6.5–8.1)

## 2015-11-22 LAB — TROPONIN I

## 2015-11-22 NOTE — ED Provider Notes (Signed)
[facility] Emergency Department Provider Note  ____________________________________________  Time seen: 4:00 AM  I have reviewed the triage vital signs and the nursing notes.   HISTORY  Chief Complaint Chest Pain     HPI Virginia [name] is a 25 y.o. female presents with "sharp central chest pain times one day that is worse with deep inspiration and movement.    Past Medical History  Diagnosis Date  . Asthma   . ADHD (attention deficit hyperactivity disorder)     There are no active problems to display for this patient.   Past Surgical History  Procedure Laterality Date  . Tubal ligation  2016    Current Outpatient Rx  Name  Route  Sig  Dispense  Refill  . amoxicillin (AMOXIL) 500 MG tablet   Oral   Take 1 tablet (500 mg total) by mouth 3 (three) times daily.   30 tablet   0   . guaiFENesin-codeine 100-10 MG/5ML syrup   Oral   Take 10 mLs by mouth every 4 (four) hours as needed for cough.   180 mL   0   . ibuprofen (ADVIL,MOTRIN) 800 MG tablet   Oral   Take 1 tablet (800 mg total) by mouth every 8 (eight) hours as needed.   30 tablet   0     Allergies No known drug allergies History reviewed. No pertinent family history.  Social History Social History  Substance Use Topics  . Smoking status: Never Smoker   . Smokeless tobacco: None  . Alcohol Use: No    Review of Systems  Constitutional: Negative for fever. Eyes: Negative for visual changes. ENT: Negative for sore throat. Cardiovascular: Positive for chest pain. Respiratory: Negative for shortness of breath. Gastrointestinal: Negative for abdominal pain, vomiting and diarrhea. Genitourinary: Negative for dysuria. Musculoskeletal: Negative for back pain. Skin: Negative for rash. Neurological: Negative for headaches, focal weakness or numbness.   10-point ROS otherwise negative.  ____________________________________________   PHYSICAL EXAM:  VITAL  SIGNS: ED Triage Vitals  Enc Vitals Group     BP 11/21/15 2242 133/76 mmHg     Pulse Rate 11/21/15 2242 91     Resp 11/21/15 2242 20     Temp 11/21/15 2242 98.1 F (36.7 C)     Temp Source 11/21/15 2242 Oral     SpO2 11/21/15 2242 99 %     Weight 11/21/15 2242 160 lb (72.576 kg)     Height 11/21/15 2242  (1.626 m)     Head Cir --      Peak Flow --      Pain Score 11/21/15 2243 8     Pain Loc --      Pain Edu? --      Excl. in GC? --      Constitutional: Alert and oriented. Well appearing and in no distress. Eyes: Conjunctivae are normal. PERRL. Normal extraocular movements. ENT   Head: Normocephalic and atraumatic.   Nose: No congestion/rhinnorhea.   Mouth/Throat: Mucous membranes are moist.   Neck: No stridor. Hematological/Lymphatic/Immunilogical: No cervical lymphadenopathy. Cardiovascular: Normal rate, regular rhythm. Normal and symmetric distal pulses are present in all extremities. No murmurs, rubs, or gallops.Tender to palpation parasternal region Respiratory: Normal respiratory effort without tachypnea nor retractions. Breath sounds are clear and equal bilaterally. No wheezes/rales/rhonchi. Gastrointestinal: Soft and nontender. No distention. There is no CVA tenderness. Genitourinary: deferred Musculoskeletal: Nontender with normal range of motion in all extremities. No joint effusions.  No lower extremity  tenderness nor edema. Neurologic:  Normal speech and language. No gross focal neurologic deficits are appreciated. Speech is normal.  Skin:  Skin is warm, dry and intact. No rash noted. Psychiatric: Mood and affect are normal. Speech and behavior are normal. Patient exhibits appropriate insight and judgment.  ____________________________________________    LABS (pertinent positives/negatives)  Labs Reviewed  CBC - Abnormal; Notable for the following:    Hemoglobin 11.3 (*)    MCV 69.8 (*)    MCH 22.5 (*)    RDW 15.4 (*)    All other  components within normal limits  COMPREHENSIVE METABOLIC PANEL - Abnormal; Notable for the following:    Potassium 3.3 (*)    CO2 21 (*)    Glucose, Bld 113 (*)    BUN 21 (*)    All other components within normal limits  URINALYSIS COMPLETEWITH MICROSCOPIC ([facility] ONLY) - Abnormal; Notable for the following:    Color, Urine YELLOW (*)    APPearance CLOUDY (*)    Squamous Epithelial / LPF 0-5 (*)    All other components within normal limits  TROPONIN I     ____________________________________________   EKG  ED ECG REPORT I, Lostine N BROWN, the attending physician, personally viewed and interpreted this ECG.   Date: 11/22/2015  EKG Time: 10:38 PM  Rate: 84 84  Rhythm: Normal sinus rhythm  Axis: Normal  Intervals: Normal  ST&T Change: None   ____________________________________________    RADIOLOGY  DG Chest 2 View (Final result) Result time: 11/21/15 23:21:42   Final result by Rad Results In Interface (11/21/15 23:21:42)   Narrative:   CLINICAL DATA: Left upper chest pain radiating into the shoulder. Onset at 20:00 yesterday.  EXAM: CHEST 2 VIEW  COMPARISON: None.  FINDINGS: The heart size and mediastinal contours are within normal limits. Both lungs are clear. The visualized skeletal structures are unremarkable.  IMPRESSION: No active cardiopulmonary disease.   Electronically Signed By: Ellery Plunkaniel R Mitchell M.D. On: 11/21/2015 23:21       INITIAL IMPRESSION / ASSESSMENT AND PLAN / ED COURSE  Pertinent labs & imaging results that were available during my care of the patient were reviewed by me and considered in my medical decision making (see chart for details).    ____________________________________________   FINAL CLINICAL IMPRESSION(S) / ED DIAGNOSES  Final diagnoses:  Costochondritis, acute      Darci Currentandolph N Brown, MD 11/22/15 [phone]

## 2015-11-22 NOTE — Discharge Instructions (Signed)

## 2016-07-21 ENCOUNTER — Telehealth: Payer: Self-pay | Admitting: Obstetrics and Gynecology

## 2016-07-21 NOTE — Telephone Encounter (Signed)
Pt called and she wanted a call back, she has a lump that has gotten bigger in her crease near hr private area, she is bleeding from her vaginal area but she should npt be on her period now, so she wasn't sure if she is bleeding due to the lump, but she has [place] med[place] so she knows that it will not cover a visit if she has to come in, she is thinking she will need to be seen but would like a call back on what she needs to do.

## 2016-07-21 NOTE — Telephone Encounter (Signed)
Called pt she states that she has a lump in the vaginal area that feels like lymph nodes, very painful and tender. Pt also notes that she is having irregular vaginal bleeding. Advised pt that she needs to be seen. Pt understands that she will need to be self pay as she has [place] medicaid.

## 2016-07-22 ENCOUNTER — Ambulatory Visit (INDEPENDENT_AMBULATORY_CARE_PROVIDER_SITE_OTHER): Payer: Self-pay | Admitting: Obstetrics and Gynecology

## 2016-07-22 VITALS — BP 114/72 | HR 121 | Temp 98.9°F | Ht 64.0 in | Wt 213.0 lb

## 2016-07-22 DIAGNOSIS — N939 Abnormal uterine and vaginal bleeding, unspecified: Secondary | ICD-10-CM

## 2016-07-22 DIAGNOSIS — R591 Generalized enlarged lymph nodes: Secondary | ICD-10-CM

## 2016-07-22 DIAGNOSIS — J020 Streptococcal pharyngitis: Secondary | ICD-10-CM

## 2016-07-22 MED ORDER — AMOXICILLIN 500 MG PO CAPS: 500.0000 mg | ORAL_CAPSULE | Freq: Two times a day (BID) | ORAL | 2 refills | Status: DC

## 2016-07-22 NOTE — Progress Notes (Signed)
    GYNECOLOGY PROGRESS NOTE  Subjective:    Patient ID: Virginia [name], female    DOB: Sep 18, 1990, 25 y.o.   MRN: [id number]  HPI  Patient is a 25 y.o. [id number] female who presents for multiple complaints.   1) patient complains of area of vaginal swelling and nodule in right groin region, has been ongoing for approximately 3-4 days.  2) patient complains of abnormal vaginal bleeding, notes that she has used approximately 13 tampons since yesterday (not all fully soaked). Notes that she had her normal period earlier this month. Denies dizziness, weakness. Denies passage of large clots. 3) patient complains of chills with associated nausea and diarrhea 1 day. Notes subjective fever yesterday. Denies recent sick contacts, or changes in diet. 4) complains of sore throat over the past 2-3 days. Has not tried anything for treatment.  The following portions of the patient's history were reviewed and updated as appropriate: allergies, current medications, past family history, past medical history, past social history, past surgical history and problem list.  Review of Systems Pertinent items noted in HPI and remainder of comprehensive ROS otherwise negative.     Objective:   Blood pressure 114/72, pulse (!) 121, height 5\' 4"  (1.626 m), weight 213 lb (96.6 kg), last menstrual period 07/22/2016. General appearance: alert and no distress   Head: Normocephalic, without obvious abnormality, atraumatic Eyes: negative findings: lids and lashes normal, conjunctivae and sclerae normal and pupils equal, round, reactive to light and accomodation Throat: normal findings: lips normal without lesions and abnormal findings: exudates present and mild oropharyngeal erythema Neck: no adenopathy, no carotid bruit, no JVD, supple, symmetrical, trachea midline and thyroid not enlarged, symmetric, no tenderness/mass/nodules Abdomen: soft, non-tender; bowel sounds normal; no masses,  no organomegaly Pelvic:  external genitalia normal.  Rectovaginal septum normal.  Vagina with small amount of dark red blood in vault.  Cervix normal appearing, no lesions and no motion tenderness.  Uterus mobile, nontender, normal shape and size.  Adnexae non-palpable, nontender bilaterally.  Extremities: extremities normal, atraumatic, no cyanosis or edema  Lymph nodes: Inguinal adenopathy: small nodule palpable in left groin, mobile, ~ 1 cm  Neurologic: Grossly normal    Assessment:   Throat exudate, suspicious for strep throat Inguinal lymphadenopathy  Abnormal uterine bleeding  Plan:   - Exudate, suspicious for strep throat. Strep throat testing positive in the office today. Will treat with amoxicillin twice a day for 10 days. Is likely the cause of adenopathy. - Abnormal uterine bleeding. May be reactive secondary to patient current strep throat infection. Her may be due to alternative cause such as structural uterine abnormality or hormonal. Patient will h/o tubal ligation for contraception. Took a home pregnancy test and was negative.  Patient notes no prior h/o irregular bleeding outside of menses.  Patient notes bleeding has slowed down since yesterday.   If intermenstrual bleeding occurs again after next period, will need further investigation with labs and possible ultrasound.   - Follow up if symptoms worsen or fail to improve.

## 2016-07-22 NOTE — Patient Instructions (Signed)
Strep Throat Strep throat is a bacterial infection of the throat. Your health care provider may call the infection tonsillitis or pharyngitis, depending on whether there is swelling in the tonsils or at the back of the throat. Strep throat is most common during the cold months of the year in children who are 5-24 years of age, but it can happen during any season in people of any age. This infection is spread from person to person (contagious) through coughing, sneezing, or close contact. What are the causes? Strep throat is caused by the bacteria called Streptococcus pyogenes. What increases the risk? This condition is more likely to develop in:  People who spend time in crowded places where the infection can spread easily.  People who have close contact with someone who has strep throat.  What are the signs or symptoms? Symptoms of this condition include:  Fever or chills.  Redness, swelling, or pain in the tonsils or throat.  Pain or difficulty when swallowing.  White or yellow spots on the tonsils or throat.  Swollen, tender glands in the neck or under the jaw.  Red rash all over the body (rare).  How is this diagnosed? This condition is diagnosed by performing a rapid strep test or by taking a swab of your throat (throat culture test). Results from a rapid strep test are usually ready in a few minutes, but throat culture test results are available after one or two days. How is this treated? This condition is treated with antibiotic medicine. Follow these instructions at home: Medicines  Take over-the-counter and prescription medicines only as told by your health care provider.  Take your antibiotic as told by your health care provider. Do not stop taking the antibiotic even if you start to feel better.  Have family members who also have a sore throat or fever tested for strep throat. They may need antibiotics if they have the strep infection. Eating and drinking  Do not  share food, drinking cups, or personal items that could cause the infection to spread to other people.  If swallowing is difficult, try eating soft foods until your sore throat feels better.  Drink enough fluid to keep your urine clear or pale yellow. General instructions  Gargle with a salt-water mixture 3-4 times per day or as needed. To make a salt-water mixture, completely dissolve -1 tsp of salt in 1 cup of warm water.  Make sure that all household members wash their hands well.  Get plenty of rest.  Stay home from school or work until you have been taking antibiotics for 24 hours.  Keep all follow-up visits as told by your health care provider. This is important. Contact a health care provider if:  The glands in your neck continue to get bigger.  You develop a rash, cough, or earache.  You cough up a thick liquid that is green, yellow-brown, or bloody.  You have pain or discomfort that does not get better with medicine.  Your problems seem to be getting worse rather than better.  You have a fever. Get help right away if:  You have new symptoms, such as vomiting, severe headache, stiff or painful neck, chest pain, or shortness of breath.  You have severe throat pain, drooling, or changes in your voice.  You have swelling of the neck, or the skin on the neck becomes red and tender.  You have signs of dehydration, such as fatigue, dry mouth, and decreased urination.  You become increasingly sleepy, or   you cannot wake up completely.  Your joints become red or painful. This information is not intended to replace advice given to you by your health care provider. Make sure you discuss any questions you have with your health care provider. Document Released: 08/01/2000 Document Revised: 04/02/2016 Document Reviewed: 11/27/2014 [employer] Interactive Patient Education  2017 [employer]  

## 2016-10-19 ENCOUNTER — Emergency Department
Admission: EM | Admit: 2016-10-19 | Discharge: 2016-10-19 | Disposition: A | Discharge status: Home / Self Care | Payer: Self-pay | Attending: Emergency Medicine | Admitting: Emergency Medicine

## 2016-10-19 ENCOUNTER — Encounter: Payer: Self-pay | Admitting: Emergency Medicine

## 2016-10-19 DIAGNOSIS — F909 Attention-deficit hyperactivity disorder, unspecified type: Secondary | ICD-10-CM | POA: Insufficient documentation

## 2016-10-19 DIAGNOSIS — R197 Diarrhea, unspecified: Secondary | ICD-10-CM | POA: Insufficient documentation

## 2016-10-19 DIAGNOSIS — R112 Nausea with vomiting, unspecified: Secondary | ICD-10-CM | POA: Insufficient documentation

## 2016-10-19 DIAGNOSIS — R1084 Generalized abdominal pain: Secondary | ICD-10-CM

## 2016-10-19 DIAGNOSIS — J45909 Unspecified asthma, uncomplicated: Secondary | ICD-10-CM | POA: Insufficient documentation

## 2016-10-19 DIAGNOSIS — R1011 Right upper quadrant pain: Secondary | ICD-10-CM | POA: Insufficient documentation

## 2016-10-19 LAB — URINALYSIS, COMPLETE (UACMP) WITH MICROSCOPIC
Bilirubin Urine: NEGATIVE
Glucose, UA: NEGATIVE mg/dL
Hgb urine dipstick: NEGATIVE
Ketones, ur: 20 mg/dL — AB
Leukocytes, UA: NEGATIVE
Nitrite: NEGATIVE
PH: 5 (ref 5.0–8.0)
Protein, ur: 30 mg/dL — AB
SPECIFIC GRAVITY, URINE: 1.027 (ref 1.005–1.030)

## 2016-10-19 LAB — COMPREHENSIVE METABOLIC PANEL
ALK PHOS: 87 U/L (ref 38–126)
ALT: 18 U/L (ref 14–54)
AST: 22 U/L (ref 15–41)
Albumin: 4.2 g/dL (ref 3.5–5.0)
Anion gap: 8 (ref 5–15)
BUN: 14 mg/dL (ref 6–20)
CALCIUM: 9.3 mg/dL (ref 8.9–10.3)
CHLORIDE: 107 mmol/L (ref 101–111)
CO2: 23 mmol/L (ref 22–32)
CREATININE: 0.46 mg/dL (ref 0.44–1.00)
GFR calc Af Amer: >60 mL/min (ref >60)
GFR calc non Af Amer: >60 mL/min (ref >60)
Glucose, Bld: 105 mg/dL — ABNORMAL HIGH (ref 65–99)
Potassium: 3.5 mmol/L (ref 3.5–5.1)
SODIUM: 138 mmol/L (ref 135–145)
Total Bilirubin: 0.9 mg/dL (ref 0.3–1.2)
Total Protein: 7.7 g/dL (ref 6.5–8.1)

## 2016-10-19 LAB — CBC
HCT: 35.5 % (ref 35.0–47.0)
Hemoglobin: 11.4 g/dL — ABNORMAL LOW (ref 12.0–16.0)
MCH: 22.2 pg — AB (ref 26.0–34.0)
MCHC: 32.1 g/dL (ref 32.0–36.0)
MCV: 69.2 fL — AB (ref 80.0–100.0)
PLATELETS: 262 10*3/uL (ref 150–440)
RBC: 5.13 MIL/uL (ref 3.80–5.20)
RDW: 16.4 % — AB (ref 11.5–14.5)
WBC: 10.3 10*3/uL (ref 3.6–11.0)

## 2016-10-19 LAB — POCT PREGNANCY, URINE: Preg Test, Ur: NEGATIVE

## 2016-10-19 LAB — LIPASE, BLOOD: LIPASE: 19 U/L (ref 11–51)

## 2016-10-19 MED ORDER — PROMETHAZINE HCL 25 MG PO TABS: 25.0000 mg | ORAL_TABLET | Freq: Four times a day (QID) | ORAL | 0 refills | Status: DC | PRN

## 2016-10-19 MED ORDER — DICYCLOMINE HCL 10 MG PO CAPS
20.0000 mg | ORAL_CAPSULE | Freq: Once | ORAL | Status: DC
Start: 2016-10-19 — End: 2016-10-19

## 2016-10-19 MED ORDER — DICYCLOMINE HCL 20 MG PO TABS: 20.0000 mg | ORAL_TABLET | Freq: Three times a day (TID) | ORAL | 0 refills | Status: DC | PRN

## 2016-10-19 MED ORDER — DICYCLOMINE HCL 20 MG PO TABS
ORAL_TABLET | ORAL | Status: AC
  Administered 2016-10-19: 20 mg via ORAL
  Filled 2016-10-19: qty 1

## 2016-10-19 MED ORDER — ONDANSETRON 4 MG PO TBDP
4.0000 mg | ORAL_TABLET | Freq: Once | ORAL | Status: AC | PRN
  Administered 2016-10-19: 4 mg via ORAL
  Filled 2016-10-19: qty 1

## 2016-10-19 MED ORDER — DICYCLOMINE HCL 20 MG PO TABS
20.0000 mg | ORAL_TABLET | ORAL | Status: AC
  Administered 2016-10-19: 20 mg via ORAL

## 2016-10-19 NOTE — ED Notes (Signed)
Family at bedside. 

## 2016-10-19 NOTE — ED Provider Notes (Signed)
[facility] Emergency Department Provider Note  ____________________________________________   First MD Initiated Contact with Patient 10/19/16 1825     (approximate)  I have reviewed the triage vital signs and the nursing notes.   HISTORY  Chief Complaint Abdominal Pain   HPI Virginia [name] is a 26 y.o. female with a history of ADHD as well as a tubal ligation was presented to the emergency department with diffuse abdominal pain which is worse the right middle abdomen as well as the right upper quadrant. She says that her symptoms started this morning and that since then she has had one episode of vomiting as well as about 11 episodes of diarrhea which she sees a yellow liquidy stool. She does not report any bleeding in her vomitus or stool. Says that she was also with a friend yesterday and says that the friend has now vomited once today but denies any other known sick contacts. Says the pain is an 8-9 out of 10 and cramping to the right side of her abdomen. No history of appendectomy.   Past Medical History:  Diagnosis Date  . ADHD (attention deficit hyperactivity disorder)   . Asthma     There are no active problems to display for this patient.   Past Surgical History:  Procedure Laterality Date  . TUBAL LIGATION  2016    Prior to Admission medications   Medication Sig Start Date End Date Taking? Authorizing Provider  amoxicillin (AMOXIL) 500 MG capsule Take 1 capsule (500 mg total) by mouth 2 (two) times daily. 07/22/16   Hildred Laser, MD    Allergies Patient has no known allergies.  History reviewed. No pertinent family history.  Social History Social History  Substance Use Topics  . Smoking status: Never Smoker  . Smokeless tobacco: Never Used  . Alcohol use No    Review of Systems Constitutional: No fever/chills Eyes: No visual changes. ENT: No sore throat. Cardiovascular: Denies chest pain. Respiratory: Denies shortness  of breath. Gastrointestinal:   No constipation. Genitourinary: Negative for dysuria. Musculoskeletal: Negative for back pain. Skin: Negative for rash. Neurological: Negative for headaches, focal weakness or numbness.  10-point ROS otherwise negative.  ____________________________________________   PHYSICAL EXAM:  VITAL SIGNS: ED Triage Vitals  Enc Vitals Group     BP 10/19/16 1756 (!) 119/48     Pulse Rate 10/19/16 1756 99     Resp 10/19/16 1756 18     Temp 10/19/16 1756 98.3 F (36.8 C)     Temp Source 10/19/16 1756 Oral     SpO2 10/19/16 1756 98 %     Weight 10/19/16 1757 200 lb (90.7 kg)     Height 10/19/16 1757 5\' 4"  (1.626 m)     Head Circumference --      Peak Flow --      Pain Score 10/19/16 1805 8     Pain Loc --      Pain Edu? --      Excl. in GC? --     Constitutional: Alert and oriented. Well appearing and in no acute distress. Eyes: Conjunctivae are normal. PERRL. EOMI. Head: Atraumatic. Nose: No congestion/rhinnorhea. Mouth/Throat: Mucous membranes are moist.   Neck: No stridor.   Cardiovascular: Normal rate, regular rhythm. Grossly normal heart sounds.  Good peripheral circulation. Respiratory: Normal respiratory effort.  No retractions. Lungs CTAB. Gastrointestinal: Soft With mild-to-moderate diffuse abdominal tenderness which is worse to the right upper quadrant. Negative Murphy sign. No distention. No CVA tenderness.  Musculoskeletal: No lower extremity tenderness nor edema.  No joint effusions. Neurologic:  Normal speech and language. No gross focal neurologic deficits are appreciated. Skin:  Skin is warm, dry and intact. No rash noted. Psychiatric: Mood and affect are normal. Speech and behavior are normal.  ____________________________________________   LABS (all labs ordered are listed, but only abnormal results are displayed)  Labs Reviewed  COMPREHENSIVE METABOLIC PANEL - Abnormal; Notable for the following:       Result Value   Glucose,  Bld 105 (*)    All other components within normal limits  CBC - Abnormal; Notable for the following:    Hemoglobin 11.4 (*)    MCV 69.2 (*)    MCH 22.2 (*)    RDW 16.4 (*)    All other components within normal limits  URINALYSIS, COMPLETE (UACMP) WITH MICROSCOPIC - Abnormal; Notable for the following:    Color, Urine YELLOW (*)    APPearance HAZY (*)    Ketones, ur 20 (*)    Protein, ur 30 (*)    Bacteria, UA RARE (*)    Squamous Epithelial / LPF 6-30 (*)    All other components within normal limits  LIPASE, BLOOD  POCT PREGNANCY, URINE   ____________________________________________  EKG   ____________________________________________  RADIOLOGY   ____________________________________________   PROCEDURES  Procedure(s) performed:   Procedures  Critical Care performed:   ____________________________________________   INITIAL IMPRESSION / ASSESSMENT AND PLAN / ED COURSE  Pertinent labs & imaging results that were available during my care of the patient were reviewed by me and considered in my medical decision making (see chart for details).  ----------------------------------------- 7:38 PM on 10/19/2016 -----------------------------------------  Patient with pain decreased after Bentyl and Zofran. Also tolerated by mouth fluids at this time. Very reassuring labs. Patient appears well. We discussed at length to possible courses at this time. We discussed CAT scanning at this time to rule out an appendicitis because of the worsened pain to the right as well as right upper quadrant of the abdomen. We also discussed more conservative treatment with Phenergan and Bentyl. The patient says that she would rather try the medicine at this time. We discussed strict return precautions that she should return for any worsening or concerning symptoms. Especially if the pain returns and becomes focused to the right lower quadrant. She is understanding of this plan and willing to  comply.      ____________________________________________   FINAL CLINICAL IMPRESSION(S) / ED DIAGNOSES  Abdominal pain with nausea vomiting and diarrhea.    NEW MEDICATIONS STARTED DURING THIS VISIT:  New Prescriptions   No medications on file     Note:  This document was prepared using Dragon voice recognition software and may include unintentional dictation errors.    Myrna Blazeravid Matthew Lara Palinkas, MD 10/19/16 [phone]

## 2016-10-19 NOTE — ED Notes (Signed)
Registration at bedside.

## 2016-10-19 NOTE — ED Triage Notes (Signed)
Pt presents to ED c/o R mid abd pain, nausea, and diarrhea starting this morning. No rebound tenderness noted at this time. x1 emesis, x11 diarrhea per pt.

## 2016-10-19 NOTE — ED Notes (Signed)
Pt given warm blanket per request.

## 2016-10-19 NOTE — ED Notes (Signed)
Pt watching tv in no acute distress.  

## 2017-02-07 ENCOUNTER — Encounter: Payer: Self-pay | Admitting: Emergency Medicine

## 2017-02-07 DIAGNOSIS — N926 Irregular menstruation, unspecified: Secondary | ICD-10-CM | POA: Insufficient documentation

## 2017-02-07 DIAGNOSIS — R5383 Other fatigue: Secondary | ICD-10-CM | POA: Insufficient documentation

## 2017-02-07 DIAGNOSIS — N644 Mastodynia: Secondary | ICD-10-CM | POA: Insufficient documentation

## 2017-02-07 DIAGNOSIS — J45909 Unspecified asthma, uncomplicated: Secondary | ICD-10-CM | POA: Insufficient documentation

## 2017-02-07 NOTE — ED Triage Notes (Signed)
Pt says she had a tubal ligation about 3 years ago but is having symptoms of being pregnant; irregular menstrual cycles, sometimes only lasting 1-2 days; believes she feels fetal movement; took a home pregnancy test that was negative; called up to our mother/baby unit and was told to come to ED for evaluation

## 2017-02-08 ENCOUNTER — Emergency Department
Admission: EM | Admit: 2017-02-08 | Discharge: 2017-02-08 | Disposition: A | Discharge status: Home / Self Care | Payer: Self-pay | Attending: Emergency Medicine | Admitting: Emergency Medicine

## 2017-02-08 DIAGNOSIS — N644 Mastodynia: Secondary | ICD-10-CM

## 2017-02-08 DIAGNOSIS — R5383 Other fatigue: Secondary | ICD-10-CM

## 2017-02-08 DIAGNOSIS — N926 Irregular menstruation, unspecified: Secondary | ICD-10-CM

## 2017-02-08 LAB — URINALYSIS, COMPLETE (UACMP) WITH MICROSCOPIC
Bilirubin Urine: NEGATIVE
Glucose, UA: NEGATIVE mg/dL
Hgb urine dipstick: NEGATIVE
Ketones, ur: NEGATIVE mg/dL
Nitrite: NEGATIVE
PROTEIN: NEGATIVE mg/dL
Specific Gravity, Urine: 1.025 (ref 1.005–1.030)
pH: 5 (ref 5.0–8.0)

## 2017-02-08 LAB — COMPREHENSIVE METABOLIC PANEL
ALBUMIN: 3.9 g/dL (ref 3.5–5.0)
ALK PHOS: 87 U/L (ref 38–126)
ALT: 19 U/L (ref 14–54)
ANION GAP: 6 (ref 5–15)
AST: 18 U/L (ref 15–41)
BUN: 19 mg/dL (ref 6–20)
CO2: 22 mmol/L (ref 22–32)
Calcium: 9.3 mg/dL (ref 8.9–10.3)
Chloride: 110 mmol/L (ref 101–111)
Creatinine, Ser: 0.63 mg/dL (ref 0.44–1.00)
GFR calc Af Amer: >60 mL/min (ref >60)
GFR calc non Af Amer: >60 mL/min (ref >60)
GLUCOSE: 91 mg/dL (ref 65–99)
POTASSIUM: 3.6 mmol/L (ref 3.5–5.1)
SODIUM: 138 mmol/L (ref 135–145)
Total Bilirubin: 0.3 mg/dL (ref 0.3–1.2)
Total Protein: 7.3 g/dL (ref 6.5–8.1)

## 2017-02-08 LAB — CBC
HEMATOCRIT: 33.9 % — AB (ref 35.0–47.0)
HEMOGLOBIN: 11 g/dL — AB (ref 12.0–16.0)
MCH: 22.8 pg — AB (ref 26.0–34.0)
MCHC: 32.4 g/dL (ref 32.0–36.0)
MCV: 70.3 fL — AB (ref 80.0–100.0)
Platelets: 253 10*3/uL (ref 150–440)
RBC: 4.83 MIL/uL (ref 3.80–5.20)
RDW: 16.4 % — ABNORMAL HIGH (ref 11.5–14.5)
WBC: 9.8 10*3/uL (ref 3.6–11.0)

## 2017-02-08 LAB — LIPASE, BLOOD: Lipase: 36 U/L (ref 11–51)

## 2017-02-08 LAB — HCG, QUANTITATIVE, PREGNANCY

## 2017-02-08 NOTE — ED Provider Notes (Signed)
[facility] Emergency Department Provider Note   ____________________________________________   First MD Initiated Contact with Patient 02/08/17 0122     (approximate)  I have reviewed the triage vital signs and the nursing notes.   HISTORY  Chief Complaint Abdominal Pain    HPI Virginia [name] is a 26 y.o. female who comes into the hospital today with a concern that she may be pregnant. The patient states that she had her tubes tied but her last 2 periods were very light she has been very sleepy and she's had some breast tenderness. She also reports that she feels though she has movement in her abdomen as if she is pregnant. The patient took a urine pregnancy test at home that was negative but she was concerned so she decided to come into the hospital for evaluation. The patient reports that she doesn't have insurance and she could not follow-up with her OB/GYN. The patient has been having these symptoms for the past 1-2 months. She denies any pain currently and denies any vaginal bleeding. She is here for evaluation   Past Medical History:  Diagnosis Date  . ADHD (attention deficit hyperactivity disorder)   . Asthma     There are no active problems to display for this patient.   Past Surgical History:  Procedure Laterality Date  . TUBAL LIGATION  2016    Prior to Admission medications   Medication Sig Start Date End Date Taking? Authorizing Provider  amoxicillin (AMOXIL) 500 MG capsule Take 1 capsule (500 mg total) by mouth 2 (two) times daily. 07/22/16   Hildred Laserherry, Anika, MD  dicyclomine (BENTYL) 20 MG tablet Take 1 tablet (20 mg total) by mouth 3 (three) times daily as needed for spasms. 10/19/16 10/19/17  Myrna BlazerSchaevitz, David Matthew, MD  promethazine (PHENERGAN) 25 MG tablet Take 1 tablet (25 mg total) by mouth every 6 (six) hours as needed for nausea or vomiting. 10/19/16   Schaevitz, Myra Rudeavid Matthew, MD    Allergies Patient has no known  allergies.  No family history on file.  Social History Social History  Substance Use Topics  . Smoking status: Never Smoker  . Smokeless tobacco: Never Used  . Alcohol use No    Review of Systems  Constitutional: Fatigue Eyes: No visual changes. ENT: No sore throat. Cardiovascular: Denies chest pain. Respiratory: Denies shortness of breath. Gastrointestinal: No abdominal pain.  No nausea, no vomiting.  No diarrhea.  No constipation. Genitourinary: Negative for dysuria. Musculoskeletal: Negative for back pain. Skin: Breast tenderness Neurological: Negative for headaches, focal weakness or numbness.   ____________________________________________   PHYSICAL EXAM:  VITAL SIGNS: ED Triage Vitals  Enc Vitals Group     BP 02/07/17 2337 (!) 142/63     Pulse Rate 02/07/17 2336 100     Resp 02/07/17 2336 18     Temp 02/07/17 2336 98.2 F (36.8 C)     Temp Source 02/07/17 2336 Oral     SpO2 02/07/17 2336 94 %     Weight 02/07/17 2337 200 lb (90.7 kg)     Height 02/07/17 2337 5\' 4"  (1.626 m)     Head Circumference --      Peak Flow --      Pain Score --      Pain Loc --      Pain Edu? --      Excl. in GC? --     Constitutional: Alert and oriented. Well appearing and in no acute distress. Eyes: Conjunctivae  are normal. PERRL. EOMI. Head: Atraumatic. Nose: No congestion/rhinnorhea. Mouth/Throat: Mucous membranes are moist.  Oropharynx non-erythematous. Cardiovascular: Normal rate, regular rhythm. Grossly normal heart sounds.  Good peripheral circulation. Respiratory: Normal respiratory effort.  No retractions. Lungs CTAB. Gastrointestinal: Soft and nontender. No distention. Positive bowel sounds Musculoskeletal: No lower extremity tenderness nor edema.   Neurologic:  Normal speech and language.  Skin:  Skin is warm, dry and intact. Marland Kitchen Psychiatric: Mood and affect are normal.   ____________________________________________   LABS (all labs ordered are listed, but  only abnormal results are displayed)  Labs Reviewed  CBC - Abnormal; Notable for the following:       Result Value   Hemoglobin 11.0 (*)    HCT 33.9 (*)    MCV 70.3 (*)    MCH 22.8 (*)    RDW 16.4 (*)    All other components within normal limits  URINALYSIS, COMPLETE (UACMP) WITH MICROSCOPIC - Abnormal; Notable for the following:    Color, Urine YELLOW (*)    APPearance HAZY (*)    Leukocytes, UA SMALL (*)    Bacteria, UA RARE (*)    Squamous Epithelial / LPF 6-30 (*)    All other components within normal limits  LIPASE, BLOOD  COMPREHENSIVE METABOLIC PANEL  HCG, QUANTITATIVE, PREGNANCY   ____________________________________________  EKG  none ____________________________________________  RADIOLOGY  No results found.  ____________________________________________   PROCEDURES  Procedure(s) performed: None  Procedures  Critical Care performed: No  ____________________________________________   INITIAL IMPRESSION / ASSESSMENT AND PLAN / ED COURSE  Pertinent labs & imaging results that were available during my care of the patient were reviewed by me and considered in my medical decision making (see chart for details).  This is a 26 year old female who comes into the hospital today with a concern that she may be pregnant. She's had some lighter and she'll cycles, fatigue and breast tenderness. The patient had some blood work including an hCG done which was negative. The patient denies any pain at this time. I did encourage the patient to follow-up with OB/GYN for further evaluation of her symptoms. She may have some hormonal imbalances or other causes of her dysfunctional uterine bleeding and her constitutional symptoms. The patient is feeling well at this time. She will be discharged home to follow-up with Dr. Valentino [name] her OB/GYN.      ____________________________________________   FINAL CLINICAL IMPRESSION(S) / ED DIAGNOSES  Final diagnoses:  Fatigue,  unspecified type  Breast tenderness in female  Abnormal menstrual periods      NEW MEDICATIONS STARTED DURING THIS VISIT:  New Prescriptions   No medications on file     Note:  This document was prepared using Dragon voice recognition software and may include unintentional dictation errors.    Rebecka Apley, MD 02/08/17 [phone]

## 2017-02-08 NOTE — Discharge Instructions (Signed)
Please follow-up with Dr. Valentino Saxonherry for further evaluation of his symptoms.

## 2017-02-09 ENCOUNTER — Telehealth: Payer: Self-pay | Admitting: Obstetrics and Gynecology

## 2017-02-09 NOTE — Telephone Encounter (Signed)
Called pt she states that no one beleives her but she feels as though she is pregnant. Pt states that "her belly moves" and she has breast tenderness. Pt also notes that cycles have been very irregular. Advised pt that her beta hcg was negative 2 days ago and that it is very unlikely that she is pregnant. Pt states that she is "freaking out" and is very concerned. Advised pt that is is not uncommon to have irregular cycles after tubal ligation. Advised pt to schedule appt for AE to discuss regulating cycles. I scheduled pt to for appt 02/11/17 8am.

## 2017-02-09 NOTE — Telephone Encounter (Signed)
Patient called stating she thinks she may be pregnant but has her tubes tied. She would like a call back. Thanks

## 2017-02-11 ENCOUNTER — Encounter: Payer: Self-pay | Admitting: Obstetrics and Gynecology

## 2017-02-11 ENCOUNTER — Ambulatory Visit (INDEPENDENT_AMBULATORY_CARE_PROVIDER_SITE_OTHER): Payer: Medicaid Other | Admitting: Obstetrics and Gynecology

## 2017-02-11 VITALS — BP 98/64 | HR 101 | Ht 64.0 in | Wt 211.9 lb

## 2017-02-11 DIAGNOSIS — D649 Anemia, unspecified: Secondary | ICD-10-CM | POA: Diagnosis not present

## 2017-02-11 DIAGNOSIS — Z0000 Encounter for general adult medical examination without abnormal findings: Secondary | ICD-10-CM | POA: Diagnosis not present

## 2017-02-11 DIAGNOSIS — Z124 Encounter for screening for malignant neoplasm of cervix: Secondary | ICD-10-CM | POA: Diagnosis not present

## 2017-02-11 DIAGNOSIS — N644 Mastodynia: Secondary | ICD-10-CM | POA: Diagnosis not present

## 2017-02-11 DIAGNOSIS — E669 Obesity, unspecified: Secondary | ICD-10-CM

## 2017-02-11 DIAGNOSIS — R5383 Other fatigue: Secondary | ICD-10-CM | POA: Diagnosis not present

## 2017-02-11 DIAGNOSIS — Z01419 Encounter for gynecological examination (general) (routine) without abnormal findings: Secondary | ICD-10-CM

## 2017-02-11 NOTE — Patient Instructions (Addendum)

## 2017-02-11 NOTE — Progress Notes (Signed)
GYNECOLOGY ANNUAL PHYSICAL EXAM PROGRESS NOTE  Subjective:    Virginia [name] is a 26 y.o. [id number] female who presents for an annual exam. The patient is not currently sexually active.  The patient wears seatbelts: yes. The patient participates in regular exercise: no. Has the patient ever been transfused or tattooed?: no. The patient reports that there is not domestic violence in her life.    The patient has the following complaints today:  1. The patient notes being seen in the ER 3 days ago due to concerns of possible pregnancy symptoms, despite h/o BTL after last pregnancy.  Noted that her last 2 periods had been very light, she's exhibited fatigue, breast tenderness, and possible movement in her stomach. Took a home pregnancy test which was negative, as well as a negative test in the ER. Patient notes her symptoms have been ongoing x 1-2 months.    Gynecologic History Menarche age: 82 Patient's last menstrual period was 01/03/2017. Contraception: tubal ligation History of STI's: H/o Chlamydia, treated Last Pap: 3 years ago. Results were: normal.  Denies h/o abnormal pap smears.   Obstetric History   G3   P3   T0   P0   A0   L3    SAB0   TAB0   Ectopic0   Multiple0   Live Births3     # Outcome Date GA Lbr Len/2nd Weight Sex Delivery Anes PTL Lv  3 Para     M Vag-Spont   LIV  2 Para     M Vag-Spont   LIV  1 Para     F Vag-Spont   LIV      Past Medical History:  Diagnosis Date  . ADHD (attention deficit hyperactivity disorder)   . Asthma     Past Surgical History:  Procedure Laterality Date  . TUBAL LIGATION  2016    Family History  Problem Relation Age of Onset  . Diabetes Maternal Grandmother   . Hypertension Neg Hx   . Thyroid disease Neg Hx   . Cancer Neg Hx     Social History   Social History  . Marital status: Married    Spouse name: N/A  . Number of children: N/A  . Years of education: N/A   Occupational History  . Not on file.   Social  History Main Topics  . Smoking status: Never Smoker  . Smokeless tobacco: Never Used  . Alcohol use No  . Drug use: Unknown  . Sexual activity: Not on file   Other Topics Concern  . Not on file   Social History Narrative  . No narrative on file    Current Outpatient Prescriptions on File Prior to Visit  Medication Sig Dispense Refill  . amoxicillin (AMOXIL) 500 MG capsule Take 1 capsule (500 mg total) by mouth 2 (two) times daily. 20 capsule 2  . dicyclomine (BENTYL) 20 MG tablet Take 1 tablet (20 mg total) by mouth 3 (three) times daily as needed for spasms. 30 tablet 0  . promethazine (PHENERGAN) 25 MG tablet Take 1 tablet (25 mg total) by mouth every 6 (six) hours as needed for nausea or vomiting. 12 tablet 0   No current facility-administered medications on file prior to visit.     No Known Allergies   Review of Systems Constitutional: positive for fatigue.  Negative for chills, fevers and sweats Eyes: negative for irritation, redness and visual disturbance Ears, nose, mouth, throat, and face: negative for hearing loss, nasal  congestion, snoring and tinnitus Respiratory: negative for asthma, cough, sputum Cardiovascular: negative for chest pain, dyspnea, exertional chest pressure/discomfort, irregular heart beat, palpitations and syncope Gastrointestinal: Positive for movement in abdomen". Negative for abdominal pain, change in bowel habits, nausea and vomiting Genitourinary: negative for abnormal menstrual periods, genital lesions, sexual problems and vaginal discharge, dysuria and urinary incontinence Integument/breast:  Positive for breast tenderness.  Negative for breast lump and nipple discharge Hematologic/lymphatic: negative for bleeding and easy bruising Musculoskeletal:negative for back pain and muscle weakness Neurological: negative for dizziness, headaches, vertigo and weakness Endocrine: negative for diabetic symptoms including polydipsia, polyuria and skin  dryness Allergic/Immunologic: negative for hay fever and urticaria        Objective:  Blood pressure 98/64, pulse (!) 101, height 5\' 4"  (1.626 m), weight 211 lb 14.4 oz (96.1 kg), last menstrual period 01/03/2017. Body mass index is 36.37 kg/m.  General Appearance:    Alert, cooperative, no distress, appears stated age, moderate obesity  Head:    Normocephalic, without obvious abnormality, atraumatic  Eyes:    PERRL, conjunctiva/corneas clear, EOM's intact, both eyes  Ears:    Normal external ear canals, both ears  Nose:   Nares normal, septum midline, mucosa normal, no drainage or sinus tenderness  Throat:   Lips, mucosa, and tongue normal; teeth and gums normal  Neck:   Supple, symmetrical, trachea midline, no adenopathy; thyroid: no enlargement/tenderness/nodules; no carotid bruit or JVD  Back:     Symmetric, no curvature, ROM normal, no CVA tenderness  Lungs:     Clear to auscultation bilaterally, respirations unlabored  Chest Wall:    No tenderness or deformity   Heart:    Regular rate and rhythm, S1 and S2 normal, no murmur, rub or gallop  Breast Exam:    No tenderness, masses, or nipple abnormality  Abdomen:     Soft, non-tender, bowel sounds active all four quadrants, no masses, no organomegaly.    Genitalia:    Pelvic:external genitalia normal, vagina without lesions, discharge, or tenderness, rectovaginal septum  normal. Cervix normal in appearance, no cervical motion tenderness, no adnexal masses or tenderness.  Uterus normal size, shape, mobile, regular contours, nontender.  Rectal:    Normal external sphincter.  No hemorrhoids appreciated. Internal exam not done.   Extremities:   Extremities normal, atraumatic, no cyanosis or edema  Pulses:   2+ and symmetric all extremities  Skin:   Skin color, texture, turgor normal, no rashes or lesions  Lymph nodes:   Cervical, supraclavicular, and axillary nodes normal  Neurologic:   CNII-XII intact, normal strength, sensation and  reflexes throughout   .  Labs:  Lab Results  Component Value Date   WBC 9.8 02/07/2017   HGB 11.0 (L) 02/07/2017   HCT 33.9 (L) 02/07/2017   MCV 70.3 (L) 02/07/2017   PLT 253 02/07/2017    Lab Results  Component Value Date   CREATININE 0.63 02/07/2017   BUN 19 02/07/2017   NA 138 02/07/2017   K 3.6 02/07/2017   CL 110 02/07/2017   CO2 22 02/07/2017    Lab Results  Component Value Date   ALT 19 02/07/2017   AST 18 02/07/2017   ALKPHOS 87 02/07/2017   BILITOT 0.3 02/07/2017    No results found for: TSH   Assessment:   Healthy female exam.  Pregnancy symptoms with negative pregnancy test Mild anemia Obesity  Plan:     Blood tests: Up to date. Breast self exam technique reviewed and patient encouraged to  perform self-exam monthly. Contraception: tubal ligation. Discussed healthy lifestyle modifications.   Reassured patient again that she was not pregnant (based on urine and serum pregnancy tests negative in ER 3 days ago).  Discussed possible regulation of hormones and symptoms with short trial of OCPs. Given samples of Lo-Loestrin x 3 months.   Pap smear performed today.  Mild anemia, encouraged increasing iron rich foods, or can take a daily multivitamin with iron.  Follow up in 1 year, or sooner if symptoms persist/worsen.     Hildred Laserherry, Prisha Hiley, MD [facility]

## 2017-02-12 ENCOUNTER — Encounter: Payer: Self-pay | Admitting: Obstetrics and Gynecology

## 2017-02-13 LAB — PAP IG, CT-NG, RFX HPV ASCU
Chlamydia, Nuc. Acid Amp: NEGATIVE
Gonococcus by Nucleic Acid Amp: NEGATIVE
PAP Smear Comment: 0

## 2018-07-25 ENCOUNTER — Emergency Department
Admission: EM | Admit: 2018-07-25 | Discharge: 2018-07-25 | Discharge status: Against Medical Advice | Payer: Self-pay | Attending: Emergency Medicine | Admitting: Emergency Medicine

## 2018-07-25 ENCOUNTER — Encounter: Payer: Self-pay | Admitting: Emergency Medicine

## 2018-07-25 ENCOUNTER — Other Ambulatory Visit: Payer: Self-pay

## 2018-07-25 DIAGNOSIS — Z5321 Procedure and treatment not carried out due to patient leaving prior to being seen by health care provider: Secondary | ICD-10-CM | POA: Insufficient documentation

## 2018-07-25 DIAGNOSIS — R1110 Vomiting, unspecified: Secondary | ICD-10-CM | POA: Insufficient documentation

## 2018-07-25 DIAGNOSIS — R52 Pain, unspecified: Secondary | ICD-10-CM | POA: Insufficient documentation

## 2018-07-25 LAB — COMPREHENSIVE METABOLIC PANEL
ALBUMIN: 3.9 g/dL (ref 3.5–5.0)
ALT: 22 U/L (ref 0–44)
AST: 18 U/L (ref 15–41)
Alkaline Phosphatase: 97 U/L (ref 38–126)
Anion gap: 10 (ref 5–15)
BILIRUBIN TOTAL: 0.7 mg/dL (ref 0.3–1.2)
BUN: 12 mg/dL (ref 6–20)
CO2: 23 mmol/L (ref 22–32)
CREATININE: 0.59 mg/dL (ref 0.44–1.00)
Calcium: 9.3 mg/dL (ref 8.9–10.3)
Chloride: 105 mmol/L (ref 98–111)
GFR calc Af Amer: >60 mL/min (ref >60)
GLUCOSE: 98 mg/dL (ref 70–99)
Potassium: 3.5 mmol/L (ref 3.5–5.1)
Sodium: 138 mmol/L (ref 135–145)
TOTAL PROTEIN: 8.1 g/dL (ref 6.5–8.1)

## 2018-07-25 LAB — CBC
HCT: 35.7 % — ABNORMAL LOW (ref 36.0–46.0)
Hemoglobin: 10.8 g/dL — ABNORMAL LOW (ref 12.0–15.0)
MCH: 21.3 pg — AB (ref 26.0–34.0)
MCHC: 30.3 g/dL (ref 30.0–36.0)
MCV: 70.4 fL — ABNORMAL LOW (ref 80.0–100.0)
PLATELETS: 324 10*3/uL (ref 150–400)
RBC: 5.07 MIL/uL (ref 3.87–5.11)
RDW: 15.2 % (ref 11.5–15.5)
WBC: 16.9 10*3/uL — ABNORMAL HIGH (ref 4.0–10.5)
nRBC: 0 % (ref 0.0–0.2)

## 2018-07-25 LAB — LIPASE, BLOOD: Lipase: 32 U/L (ref 11–51)

## 2018-07-25 MED ORDER — ONDANSETRON 4 MG PO TBDP
4.0000 mg | ORAL_TABLET | Freq: Once | ORAL | Status: AC | PRN
  Administered 2018-07-25: 4 mg via ORAL
  Filled 2018-07-25: qty 1

## 2018-07-25 NOTE — ED Notes (Signed)
Pt called in the WR with no response 

## 2018-07-25 NOTE — ED Notes (Signed)
Pt called in the WR with no response will reattempt in 5 min 

## 2018-07-25 NOTE — ED Notes (Signed)
Pt called in the WR with no answer will attempt again in 

## 2018-07-25 NOTE — ED Triage Notes (Signed)
Patient reports general body aches, fever and head congestion x4 days. States last night she started vomiting and now has a headache.

## 2018-07-26 ENCOUNTER — Other Ambulatory Visit: Payer: Self-pay

## 2018-07-26 ENCOUNTER — Emergency Department
Admission: EM | Admit: 2018-07-26 | Discharge: 2018-07-26 | Disposition: A | Discharge status: Home / Self Care | Payer: Self-pay | Attending: Emergency Medicine | Admitting: Emergency Medicine

## 2018-07-26 ENCOUNTER — Emergency Department: Payer: Self-pay

## 2018-07-26 ENCOUNTER — Encounter: Payer: Self-pay | Admitting: Radiology

## 2018-07-26 DIAGNOSIS — R1084 Generalized abdominal pain: Secondary | ICD-10-CM | POA: Insufficient documentation

## 2018-07-26 DIAGNOSIS — J45909 Unspecified asthma, uncomplicated: Secondary | ICD-10-CM | POA: Insufficient documentation

## 2018-07-26 DIAGNOSIS — R112 Nausea with vomiting, unspecified: Secondary | ICD-10-CM | POA: Insufficient documentation

## 2018-07-26 DIAGNOSIS — R1012 Left upper quadrant pain: Secondary | ICD-10-CM | POA: Insufficient documentation

## 2018-07-26 DIAGNOSIS — N390 Urinary tract infection, site not specified: Secondary | ICD-10-CM

## 2018-07-26 LAB — URINALYSIS, COMPLETE (UACMP) WITH MICROSCOPIC
Bacteria, UA: NONE SEEN
Bilirubin Urine: NEGATIVE
Glucose, UA: NEGATIVE mg/dL
Ketones, ur: 20 mg/dL — AB
Nitrite: NEGATIVE
PH: 5 (ref 5.0–8.0)
Protein, ur: 100 mg/dL — AB
RBC / HPF: >50 RBC/hpf — ABNORMAL HIGH (ref 0–5)
Specific Gravity, Urine: 1.03 (ref 1.005–1.030)

## 2018-07-26 LAB — CBC
HCT: 35.4 % — ABNORMAL LOW (ref 36.0–46.0)
Hemoglobin: 10.8 g/dL — ABNORMAL LOW (ref 12.0–15.0)
MCH: 21.4 pg — ABNORMAL LOW (ref 26.0–34.0)
MCHC: 30.5 g/dL (ref 30.0–36.0)
MCV: 70.1 fL — AB (ref 80.0–100.0)
PLATELETS: 337 10*3/uL (ref 150–400)
RBC: 5.05 MIL/uL (ref 3.87–5.11)
RDW: 15.3 % (ref 11.5–15.5)
WBC: 14.2 10*3/uL — AB (ref 4.0–10.5)
nRBC: 0 % (ref 0.0–0.2)

## 2018-07-26 LAB — COMPREHENSIVE METABOLIC PANEL
ALK PHOS: 95 U/L (ref 38–126)
ALT: 21 U/L (ref 0–44)
ANION GAP: 10 (ref 5–15)
AST: 16 U/L (ref 15–41)
Albumin: 3.8 g/dL (ref 3.5–5.0)
BUN: 15 mg/dL (ref 6–20)
CALCIUM: 9.4 mg/dL (ref 8.9–10.3)
CHLORIDE: 104 mmol/L (ref 98–111)
CO2: 25 mmol/L (ref 22–32)
CREATININE: 0.71 mg/dL (ref 0.44–1.00)
Glucose, Bld: 121 mg/dL — ABNORMAL HIGH (ref 70–99)
Potassium: 3.3 mmol/L — ABNORMAL LOW (ref 3.5–5.1)
SODIUM: 139 mmol/L (ref 135–145)
Total Bilirubin: 0.9 mg/dL (ref 0.3–1.2)
Total Protein: 8.1 g/dL (ref 6.5–8.1)

## 2018-07-26 LAB — POCT PREGNANCY, URINE: Preg Test, Ur: NEGATIVE

## 2018-07-26 LAB — LIPASE, BLOOD: LIPASE: 33 U/L (ref 11–51)

## 2018-07-26 LAB — MONONUCLEOSIS SCREEN: Mono Screen: NEGATIVE

## 2018-07-26 MED ORDER — ONDANSETRON 4 MG PO TBDP
4.0000 mg | ORAL_TABLET | Freq: Once | ORAL | Status: AC | PRN
  Administered 2018-07-26: 4 mg via ORAL

## 2018-07-26 MED ORDER — ONDANSETRON HCL 4 MG/2ML IJ SOLN
4.0000 mg | Freq: Once | INTRAMUSCULAR | Status: AC
  Administered 2018-07-26: 4 mg via INTRAVENOUS
  Filled 2018-07-26: qty 2

## 2018-07-26 MED ORDER — ONDANSETRON 4 MG PO TBDP
ORAL_TABLET | ORAL | Status: AC
  Filled 2018-07-26: qty 1

## 2018-07-26 MED ORDER — CEPHALEXIN 500 MG PO CAPS: 500.0000 mg | ORAL_CAPSULE | Freq: Three times a day (TID) | ORAL | 0 refills | Status: DC

## 2018-07-26 MED ORDER — DICYCLOMINE HCL 20 MG PO TABS
20.0000 mg | ORAL_TABLET | Freq: Once | ORAL | Status: AC
  Administered 2018-07-26: 20 mg via ORAL
  Filled 2018-07-26: qty 1

## 2018-07-26 MED ORDER — DICYCLOMINE HCL 20 MG PO TABS: 20.0000 mg | ORAL_TABLET | Freq: Four times a day (QID) | ORAL | 0 refills | Status: DC | PRN

## 2018-07-26 MED ORDER — IOPAMIDOL (ISOVUE-300) INJECTION 61%
30.0000 mL | Freq: Once | INTRAVENOUS | Status: AC | PRN
  Administered 2018-07-26: 30 mL via ORAL

## 2018-07-26 MED ORDER — ONDANSETRON 4 MG PO TBDP: 4.0000 mg | ORAL_TABLET | Freq: Three times a day (TID) | ORAL | 0 refills | Status: DC | PRN

## 2018-07-26 MED ORDER — SODIUM CHLORIDE 0.9 % IV BOLUS
1000.0000 mL | Freq: Once | INTRAVENOUS | Status: AC
  Administered 2018-07-26: 1000 mL via INTRAVENOUS

## 2018-07-26 MED ORDER — FAMOTIDINE IN NACL 20-0.9 MG/50ML-% IV SOLN
20.0000 mg | Freq: Once | INTRAVENOUS | Status: AC
  Administered 2018-07-26: 20 mg via INTRAVENOUS
  Filled 2018-07-26: qty 50

## 2018-07-26 MED ORDER — IOPAMIDOL (ISOVUE-300) INJECTION 61%
100.0000 mL | Freq: Once | INTRAVENOUS | Status: AC | PRN
  Administered 2018-07-26: 100 mL via INTRAVENOUS

## 2018-07-26 MED ORDER — HYDROCODONE-ACETAMINOPHEN 5-325 MG PO TABS: 1.0000 | ORAL_TABLET | ORAL | 0 refills | Status: DC | PRN

## 2018-07-26 MED ORDER — MORPHINE SULFATE (PF) 4 MG/ML IV SOLN
4.0000 mg | Freq: Once | INTRAVENOUS | Status: AC
  Administered 2018-07-26: 4 mg via INTRAVENOUS
  Filled 2018-07-26: qty 1

## 2018-07-26 MED ORDER — SODIUM CHLORIDE 0.9 % IV SOLN
1.0000 g | Freq: Once | INTRAVENOUS | Status: AC
  Administered 2018-07-26: 1 g via INTRAVENOUS
  Filled 2018-07-26: qty 10

## 2018-07-26 NOTE — ED Notes (Signed)
Pt alert and oriented X4, active, cooperative, pt in NAD. RR even and unlabored, color WNL.  Pt informed to return if any life threatening symptoms occur.  Discharge and followup instructions reviewed. Ambulates safely. 

## 2018-07-26 NOTE — ED Provider Notes (Signed)
-----------------------------------------   9:00 AM on 07/26/2018 -----------------------------------------  Monotest is negative.  Work-up is largely within normal limits besides possible punctate kidney stone.  Patient will be discharged with PCP follow-up.  Discussed return precautions.  Patient agreeable to plan of care.   Minna AntisPaduchowski, Montrice Gracey, MD 07/26/18 0900

## 2018-07-26 NOTE — ED Notes (Signed)
Patient sitting in wheelchair in lobby in no acute distress. 

## 2018-07-26 NOTE — ED Triage Notes (Signed)
Pt with luq pain for 3 days with nausea and vomiting. Pt denies diarrhea but states has had a fever. Pt actively vomiting on arrival.

## 2018-07-26 NOTE — ED Provider Notes (Signed)
[facility] Emergency Department Provider Note   ____________________________________________   First MD Initiated Contact with Patient 07/26/18 [phone]     (approximate)  I have reviewed the triage vital signs and the nursing notes.   HISTORY  Chief Complaint Abdominal Pain    HPI Virginia [name] is a 27 y.o. female who presented to the ED from home with a chief complaint of abdominal pain, nausea and vomiting.  Patient complains of a 3-day history of generalized and left upper quadrant abdominal pain associated with nausea and vomiting.  Denies diarrhea; last bowel movement 3 days ago.  Complains of subjective fever, congestion and cough.  Denies associated chest pain, dysuria.  Denies recent travel or trauma.   Past Medical History:  Diagnosis Date  . ADHD (attention deficit hyperactivity disorder)   . Asthma     There are no active problems to display for this patient.   Past Surgical History:  Procedure Laterality Date  . TUBAL LIGATION  2016    Prior to Admission medications   Medication Sig Start Date End Date Taking? Authorizing Provider  cephALEXin (KEFLEX) 500 MG capsule Take 1 capsule (500 mg total) by mouth 3 (three) times daily. 07/26/18   Irean Hong, MD  ondansetron (ZOFRAN ODT) 4 MG disintegrating tablet Take 1 tablet (4 mg total) by mouth every 8 (eight) hours as needed for nausea or vomiting. 07/26/18   Irean Hong, MD    Allergies Patient has no known allergies.  Family History  Problem Relation Age of Onset  . Diabetes Maternal Grandmother   . Hypertension Neg Hx   . Thyroid disease Neg Hx   . Cancer Neg Hx     Social History Social History   Tobacco Use  . Smoking status: Never Smoker  . Smokeless tobacco: Never Used  Substance Use Topics  . Alcohol use: No  . Drug use: No    Review of Systems  Constitutional: Positive for fever. Eyes: No visual changes. ENT: No sore throat. Cardiovascular: Denies chest  pain. Respiratory: Denies shortness of breath. Gastrointestinal: Positive for abdominal pain, nausea and vomiting.  No diarrhea.  No constipation. Genitourinary: Negative for dysuria. Musculoskeletal: Negative for back pain. Skin: Negative for rash. Neurological: Negative for headaches, focal weakness or numbness.   ____________________________________________   PHYSICAL EXAM:  VITAL SIGNS: ED Triage Vitals  Enc Vitals Group     BP 07/26/18 0118 108/90     Pulse Rate 07/26/18 0118 98     Resp 07/26/18 0118 18     Temp 07/26/18 0118 98.6 F (37 C)     Temp Source 07/26/18 0118 Oral     SpO2 07/26/18 0118 100 %     Weight 07/26/18 0120 190 lb (86.2 kg)     Height 07/26/18 0120 5\' 4"  (1.626 m)     Head Circumference --      Peak Flow --      Pain Score 07/26/18 0122 8     Pain Loc --      Pain Edu? --      Excl. in GC? --     Constitutional: Alert and oriented.  Uncomfortable appearing and in mild acute distress. Eyes: Conjunctivae are normal. PERRL. EOMI. Head: Atraumatic. Nose: No congestion/rhinnorhea. Mouth/Throat: Mucous membranes are moderately dry.  Oropharynx non-erythematous. Neck: No stridor.   Cardiovascular: Normal rate, regular rhythm. Grossly normal heart sounds.  Good peripheral circulation. Respiratory: Normal respiratory effort.  No retractions. Lungs CTAB. Gastrointestinal: Soft  with mild diffuse tenderness to palpation without rebound or guarding. No distention. No abdominal bruits. No CVA tenderness. Musculoskeletal: No lower extremity tenderness nor edema.  No joint effusions. Neurologic:  Normal speech and language. No gross focal neurologic deficits are appreciated. No gait instability. Skin:  Skin is warm, dry and intact. No rash noted. Psychiatric: Mood and affect are normal. Speech and behavior are normal.  ____________________________________________   LABS (all labs ordered are listed, but only abnormal results are displayed)  Labs  Reviewed  COMPREHENSIVE METABOLIC PANEL - Abnormal; Notable for the following components:      Result Value   Potassium 3.3 (*)    Glucose, Bld 121 (*)    All other components within normal limits  CBC - Abnormal; Notable for the following components:   WBC 14.2 (*)    Hemoglobin 10.8 (*)    HCT 35.4 (*)    MCV 70.1 (*)    MCH 21.4 (*)    All other components within normal limits  URINALYSIS, COMPLETE (UACMP) WITH MICROSCOPIC - Abnormal; Notable for the following components:   Color, Urine AMBER (*)    APPearance HAZY (*)    Hgb urine dipstick MODERATE (*)    Ketones, ur 20 (*)    Protein, ur 100 (*)    Leukocytes, UA TRACE (*)    RBC / HPF >50 (*)    All other components within normal limits  LIPASE, BLOOD  MONONUCLEOSIS SCREEN  POC URINE PREG, ED  POCT PREGNANCY, URINE   ____________________________________________  EKG  ED ECG REPORT I, Murray Durrell J, the attending physician, personally viewed and interpreted this ECG.   Date: 07/26/2018  EKG Time: 0132  Rate: 93  Rhythm: normal EKG, normal sinus rhythm  Axis: Normal  Intervals: Incomplete RBBB  ST&T Change: Nonspecific  ____________________________________________  RADIOLOGY  ED MD interpretation:  No acute cardiopulmonary process; minimal right hydronephrosis  Official radiology report(s): Ct Abdomen Pelvis W Contrast  Result Date: 07/26/2018 CLINICAL DATA:  27 year old female with abdominal pain, nausea vomiting. EXAM: CT ABDOMEN AND PELVIS WITH CONTRAST TECHNIQUE: Multidetector CT imaging of the abdomen and pelvis was performed using the standard protocol following bolus administration of intravenous contrast. CONTRAST:  ISOVUE-300 IOPAMIDOL (ISOVUE-300) INJECTION 61% COMPARISON:  None. FINDINGS: Lower chest: The visualized lung bases are clear. No intra-abdominal free air or free fluid. Hepatobiliary: No focal liver abnormality is seen. No gallstones, gallbladder wall thickening, or biliary dilatation.  Pancreas: Unremarkable. No pancreatic ductal dilatation or surrounding inflammatory changes. Spleen: Normal in size without focal abnormality. Adrenals/Urinary Tract: The adrenal glands are unremarkable. There is a 4 mm nonobstructing right renal inferior pole calculus. There is minimal right hydronephrosis. Faint punctate calculi may be present in the region of the right UPJ (series 2, image 41 and coronal series 5 image 54). The left kidney is unremarkable. The left ureter and urinary bladder appear unremarkable. Stomach/Bowel: There is no bowel obstruction or active inflammation. The appendix is unremarkable. Vascular/Lymphatic: No significant vascular findings are present. No enlarged abdominal or pelvic lymph nodes. Reproductive: The uterus is grossly unremarkable. Bilateral tubal ligation clips noted. The ovaries are unremarkable. A tampon is noted in the vagina. Other: None Musculoskeletal: No acute or significant osseous findings. IMPRESSION: 1. Minimal right hydronephrosis possibly secondary to faint punctate right UPJ calculi. 2. A 4 mm nonobstructing right renal inferior pole calculus. 3. No bowel obstruction or active inflammation. Normal appendix. Electronically Signed   By: Elgie Collard M.D.   On: 07/26/2018 06:50  Dg Chest Port 1 View  Result Date: 07/26/2018 CLINICAL DATA:  Initial evaluation for acute fever, congestion. EXAM: PORTABLE CHEST 1 VIEW COMPARISON:  Prior radiograph from 11/21/2015. FINDINGS: The cardiac and mediastinal silhouettes are stable in size and contour, and remain within normal limits. The lungs are normally inflated. No airspace consolidation, pleural effusion, or pulmonary edema is identified. There is no pneumothorax. No acute osseous abnormality identified. IMPRESSION: No active cardiopulmonary disease. Electronically Signed   By: Rise MuBenjamin  McClintock M.D.   On: 07/26/2018 05:33    ____________________________________________   PROCEDURES  Procedure(s)  performed: None  Procedures  Critical Care performed: No  ____________________________________________   INITIAL IMPRESSION / ASSESSMENT AND PLAN / ED COURSE  As part of my medical decision making, I reviewed the following data within the electronic MEDICAL RECORD NUMBER Nursing notes reviewed and incorporated, Labs reviewed, EKG interpreted, Old chart reviewed, Radiograph reviewed and Notes from prior ED visits   27 year old female who presents with abdominal pain, nausea, and vomiting. Differential diagnosis includes, but is not limited to, biliary disease (biliary colic, acute cholecystitis, cholangitis, choledocholithiasis, etc), intrathoracic causes for epigastric abdominal pain including ACS, gastritis, duodenitis, pancreatitis, small bowel or large bowel obstruction, abdominal aortic aneurysm, hernia, and ulcer(s).  Laboratory results remarkable for moderate leukocytosis, trace leukocytes in urine, dehydration.  Will check Monospot, initiate IV fluid resuscitation, 4 mg IV morphine for pain, paired with 4mg  IV Zofran for nausea.  Will proceed with CT abdomen/pelvis to evaluate for intra-abdominal etiology patient's symptoms.  Clinical Course as of Jul 26 699  Mon Jul 26, 2018  [id number] Updated patient on test results; Monospot pending.  Will trial ice chips.  Anticipate discharge home if patient is able to tolerate PO. Strict return precautions given. Patient verbalizes understanding and agrees with plan of care.   [JS]    Clinical Course User Index [JS] Irean HongSung, Daegen Berrocal J, MD     ____________________________________________   FINAL CLINICAL IMPRESSION(S) / ED DIAGNOSES  Final diagnoses:  Generalized abdominal pain  Nausea and vomiting, intractability of vomiting not specified, unspecified vomiting type  Lower urinary tract infectious disease     ED Discharge Orders         Ordered    cephALEXin (KEFLEX) 500 MG capsule  3 times daily     07/26/18 0655    ondansetron (ZOFRAN ODT)  4 MG disintegrating tablet  Every 8 hours PRN     07/26/18 0655           Note:  This document was prepared using Dragon voice recognition software and may include unintentional dictation errors.    Irean HongSung, Neal Trulson J, MD 07/26/18 0700

## 2018-07-26 NOTE — Discharge Instructions (Signed)
1.  Take antibiotic as prescribed (Keflex 500 mg 3 times daily x5 days). 2.  Clear liquids x12 hours, then slowly advance as tolerated. 3.  You may take Zofran as needed for nausea/vomiting. 4.  Return to the ER for worsening symptoms, persistent vomiting concerns.

## 2018-07-26 NOTE — ED Notes (Signed)
Patient asking for ice chips. Patient informed nothing by mouth until she sees the MD. Patient given update on wait time. Patient verbalizes understanding.

## 2020-02-03 ENCOUNTER — Telehealth: Payer: Self-pay | Admitting: Obstetrics and Gynecology

## 2020-02-03 NOTE — Telephone Encounter (Signed)
Please advise. Thanks Eddith Mentor 

## 2020-02-03 NOTE — Telephone Encounter (Signed)
Pt called in and stated that she had her tubes tied, so she thinks. The pt is requesting a call back she stated she would like to have them  Untied. The pt didn't know what she had done before. I told the pt that a nurse will give her a call but we have 24 to 48 hours. Please advise

## 2020-07-31 ENCOUNTER — Encounter: Payer: Medicaid Other | Admitting: Certified Nurse Midwife

## 2020-08-31 IMAGING — CT CT ABD-PELV W/ CM
2 of 4 series · 16 of 46 positions shown, 18 images · IV contrast (APPLIED)
Comparison: None.

CLINICAL DATA: 27-year-old female with abdominal pain, nausea
vomiting.

EXAM:
CT ABDOMEN AND PELVIS WITH CONTRAST
TECHNIQUE: Multidetector CT imaging of the abdomen and pelvis was performed
using the standard protocol following bolus administration of
intravenous contrast.
CONTRAST:  100mL 5X8JBC-XCC IOPAMIDOL (5X8JBC-XCC) INJECTION 61%

[Series 2: routine abd/pel with · axial · 0.78mm/px · z∈[-951,-511]mm · 13 of 98 slices shown, 15 images]
[im 5/98  soft-tissue]
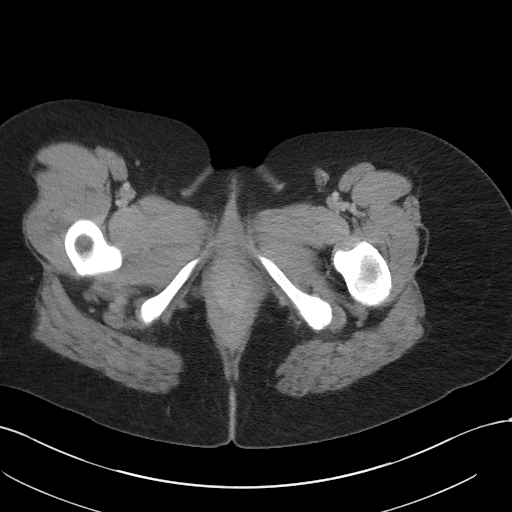
[im 5/98  bone]
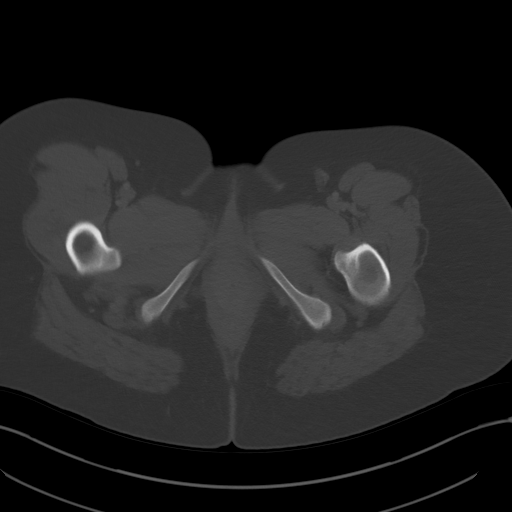
[im 13/98  soft-tissue]
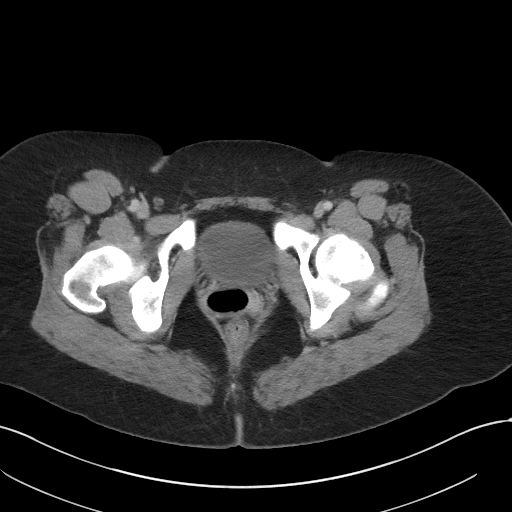
[im 22/98  soft-tissue]
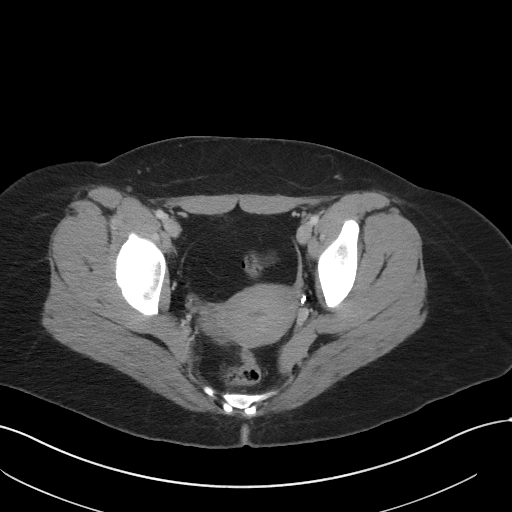
[im 26/98  soft-tissue]
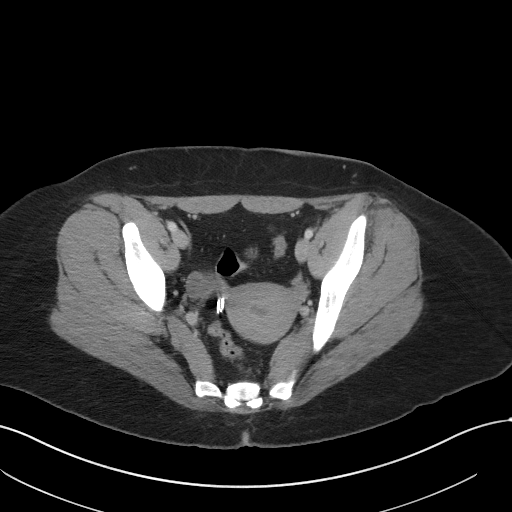
[im 34/98  soft-tissue]
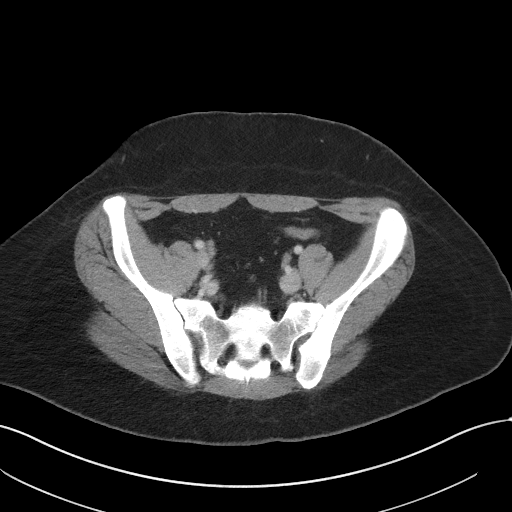
[im 43/98  soft-tissue]
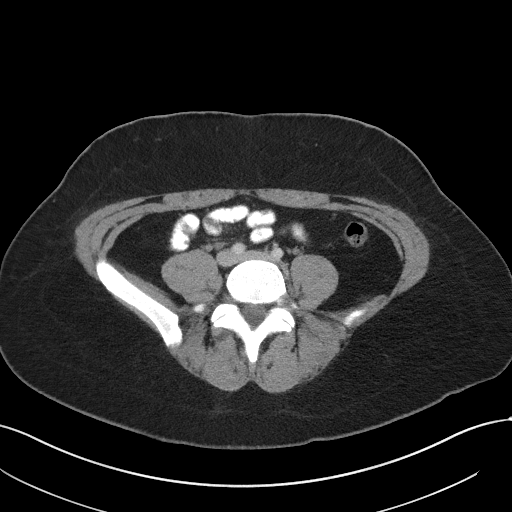
[im 51/98  soft-tissue]
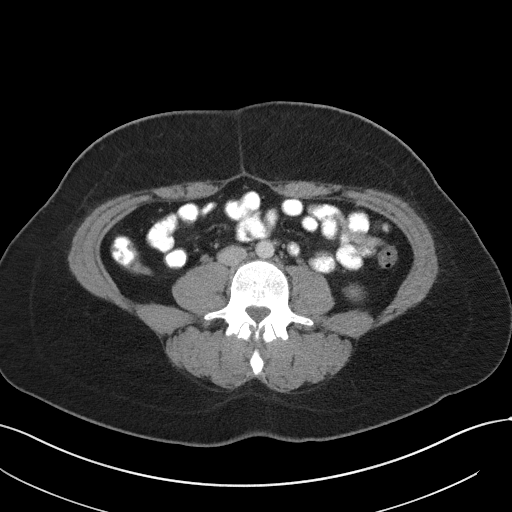
[im 55/98  soft-tissue]
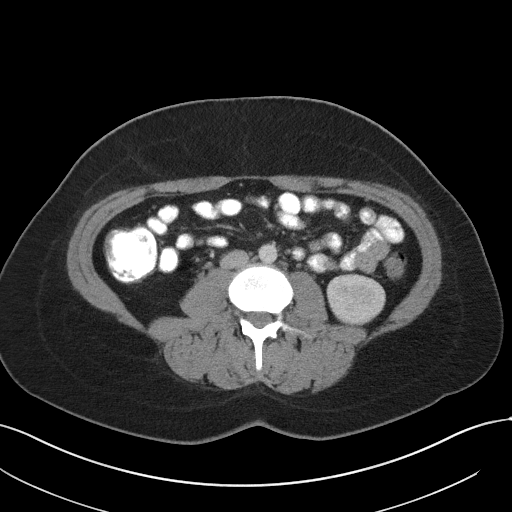
[im 64/98  soft-tissue]
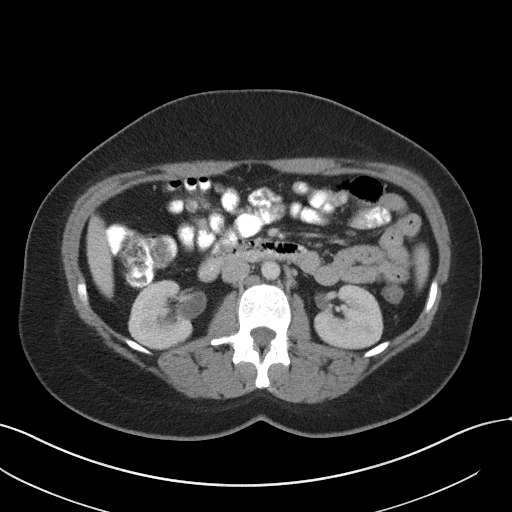
[im 64/98  bone]
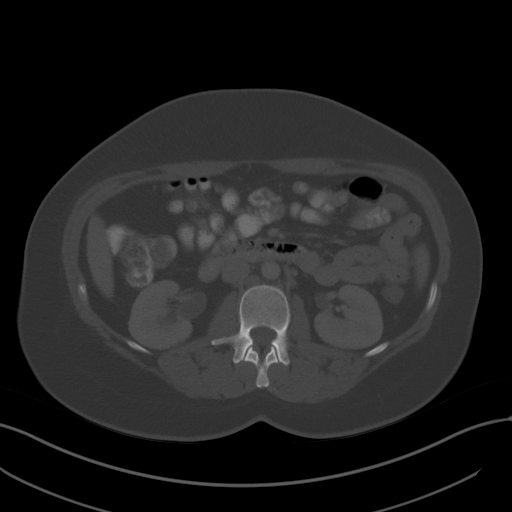
[im 72/98  soft-tissue]
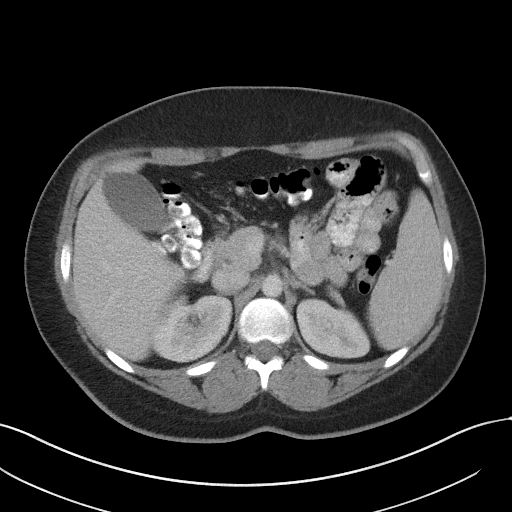
[im 76/98  soft-tissue]
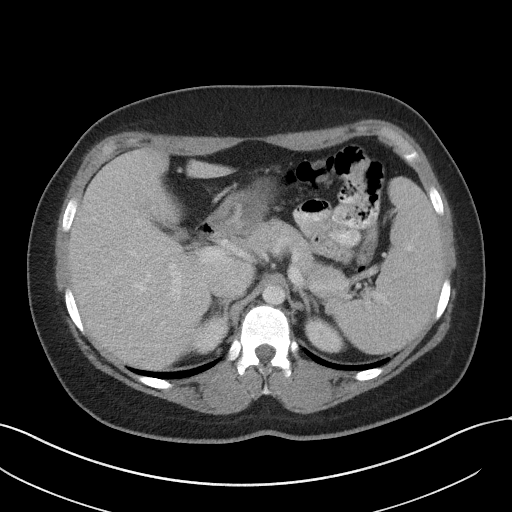
[im 85/98  soft-tissue]
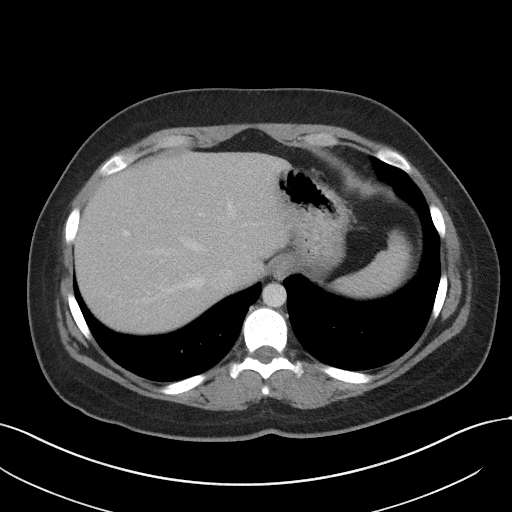
[im 93/98  soft-tissue]
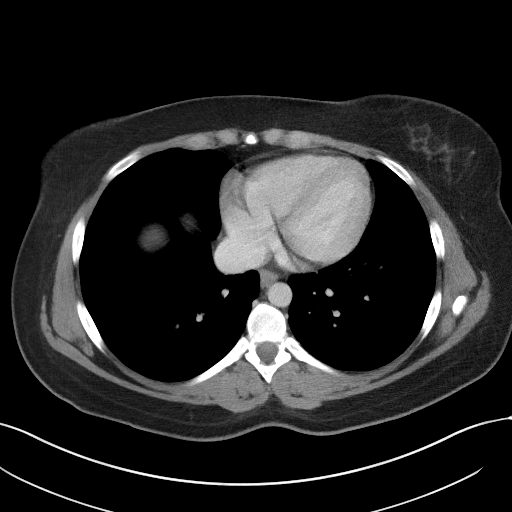

[Series 5: coronal st · coronal · 0.82mm/px · 3 of 95 slices shown]
[im 32/95  soft-tissue]
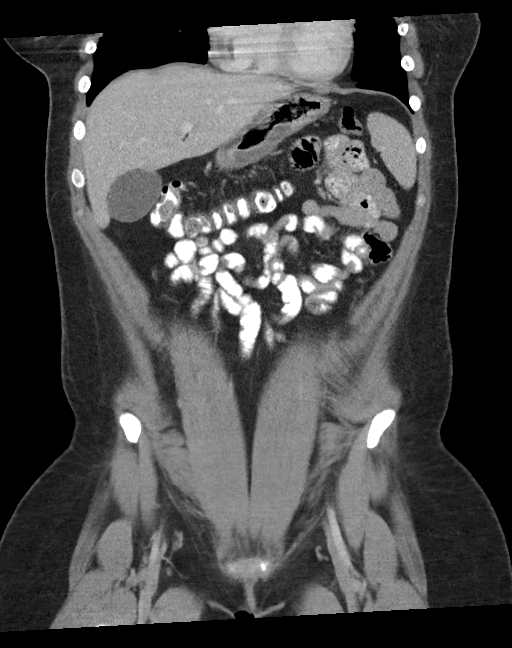
[im 42/95  soft-tissue]
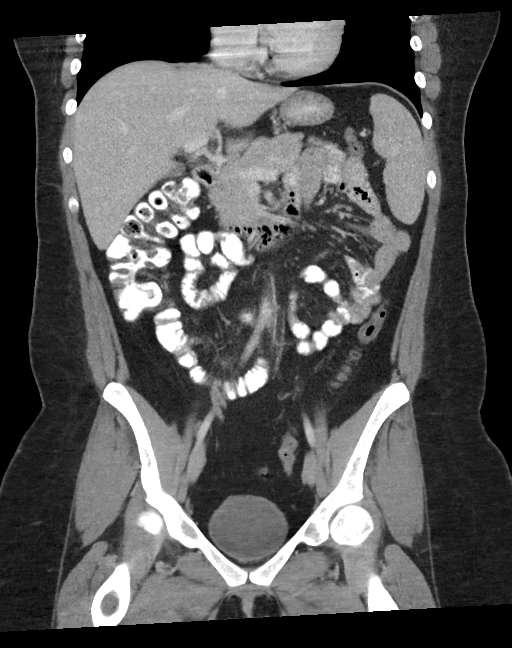
[im 53/95  soft-tissue]
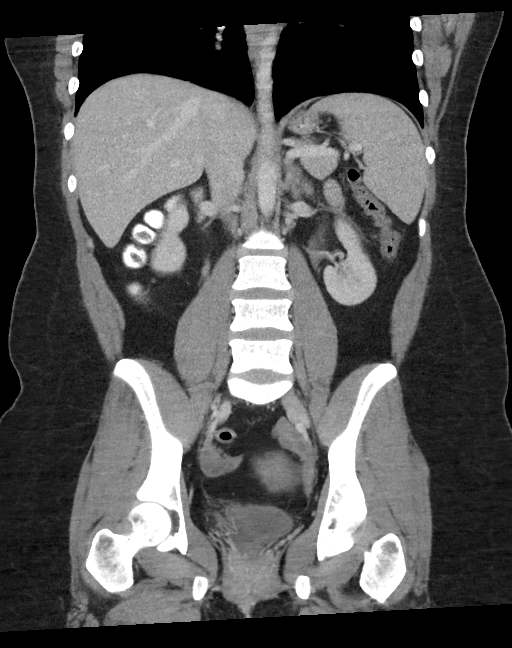

[16 of 46 positions shown; findings below may reference images not displayed]

FINDINGS: Lower chest: The visualized lung bases are clear.

No intra-abdominal free air or free fluid.

Hepatobiliary: No focal liver abnormality is seen. No gallstones,
gallbladder wall thickening, or biliary dilatation.

Pancreas: Unremarkable. No pancreatic ductal dilatation or
surrounding inflammatory changes.

Spleen: Normal in size without focal abnormality.

Adrenals/Urinary Tract: The adrenal glands are unremarkable. There
is a 4 mm nonobstructing right renal inferior pole calculus. There
is minimal right hydronephrosis. Faint punctate calculi may be
present in the region of the right UPJ (series 2, image 41 and
coronal series 5 image 54). The left kidney is unremarkable. The
left ureter and urinary bladder appear unremarkable.

Stomach/Bowel: There is no bowel obstruction or active inflammation.
The appendix is unremarkable.

Vascular/Lymphatic: No significant vascular findings are present. No
enlarged abdominal or pelvic lymph nodes.

Reproductive: The uterus is grossly unremarkable. Bilateral tubal
ligation clips noted. The ovaries are unremarkable. A tampon is
noted in the vagina.

Other: None

Musculoskeletal: No acute or significant osseous findings.
IMPRESSION: 1. Minimal right hydronephrosis possibly secondary to faint punctate
right UPJ calculi.
2. A 4 mm nonobstructing right renal inferior pole calculus.
3. No bowel obstruction or active inflammation. Normal appendix.

## 2021-04-03 ENCOUNTER — Other Ambulatory Visit (HOSPITAL_COMMUNITY)
Admission: RE | Admit: 2021-04-03 | Discharge: 2021-04-03 | Disposition: A | Discharge status: Home / Self Care | Payer: Medicaid Other | Source: Ambulatory Visit | Attending: Certified Nurse Midwife | Admitting: Certified Nurse Midwife

## 2021-04-03 ENCOUNTER — Ambulatory Visit (INDEPENDENT_AMBULATORY_CARE_PROVIDER_SITE_OTHER): Payer: 59 | Admitting: Certified Nurse Midwife

## 2021-04-03 ENCOUNTER — Encounter: Payer: Self-pay | Admitting: Certified Nurse Midwife

## 2021-04-03 ENCOUNTER — Other Ambulatory Visit: Payer: Self-pay

## 2021-04-03 VITALS — BP 111/68 | HR 86 | Ht 64.0 in | Wt 223.0 lb

## 2021-04-03 DIAGNOSIS — E669 Obesity, unspecified: Secondary | ICD-10-CM

## 2021-04-03 DIAGNOSIS — Z01419 Encounter for gynecological examination (general) (routine) without abnormal findings: Secondary | ICD-10-CM

## 2021-04-03 DIAGNOSIS — Z124 Encounter for screening for malignant neoplasm of cervix: Secondary | ICD-10-CM

## 2021-04-03 DIAGNOSIS — Z113 Encounter for screening for infections with a predominantly sexual mode of transmission: Secondary | ICD-10-CM | POA: Insufficient documentation

## 2021-04-03 DIAGNOSIS — R399 Unspecified symptoms and signs involving the genitourinary system: Secondary | ICD-10-CM

## 2021-04-03 LAB — POCT URINALYSIS DIPSTICK
Bilirubin, UA: NEGATIVE
Glucose, UA: NEGATIVE
Ketones, UA: NEGATIVE
Nitrite, UA: NEGATIVE
Odor: NEGATIVE
Protein, UA: NEGATIVE
Spec Grav, UA: 1.015 (ref 1.010–1.025)
Urobilinogen, UA: 0.2 E.U./dL
pH, UA: 6.5 (ref 5.0–8.0)

## 2021-04-03 NOTE — Patient Instructions (Signed)
Preventive Care 21-29 Years Old, Female Preventive care refers to lifestyle choices and visits with your health care provider that can promote health and wellness. This includes: A yearly physical exam. This is also called an annual wellness visit. Regular dental and eye exams. Immunizations. Screening for certain conditions. Healthy lifestyle choices, such as: Eating a healthy diet. Getting regular exercise. Not using drugs or products that contain nicotine and tobacco. Limiting alcohol use. What can I expect for my preventive care visit? Physical exam Your health care provider may check your: Height and weight. These may be used to calculate your BMI (body mass index). BMI is a measurement that tells if you are at a healthy weight. Heart rate and blood pressure. Body temperature. Skin for abnormal spots. Counseling Your health care provider may ask you questions about your: Past medical problems. Family's medical history. Alcohol, tobacco, and drug use. Emotional well-being. Home life and relationship well-being. Sexual activity. Diet, exercise, and sleep habits. Work and work environment. Access to firearms. Method of birth control. Menstrual cycle. Pregnancy history. What immunizations do I need?  Vaccines are usually given at various ages, according to a schedule. Your health care provider will recommend vaccines for you based on your age, medicalhistory, and lifestyle or other factors, such as travel or where you work. What tests do I need?  Blood tests Lipid and cholesterol levels. These may be checked every 5 years starting at age 20. Hepatitis C test. Hepatitis B test. Screening Diabetes screening. This is done by checking your blood sugar (glucose) after you have not eaten for a while (fasting). STD (sexually transmitted disease) testing, if you are at risk. BRCA-related cancer screening. This may be done if you have a family history of breast, ovarian, tubal, or  peritoneal cancers. Pelvic exam and Pap test. This may be done every 3 years starting at age 21. Starting at age 30, this may be done every 5 years if you have a Pap test in combination with an HPV test. Talk with your health care provider about your test results, treatment options,and if necessary, the need for more tests. Follow these instructions at home: Eating and drinking  Eat a healthy diet that includes fresh fruits and vegetables, whole grains, lean protein, and low-fat dairy products. Take vitamin and mineral supplements as recommended by your health care provider. Do not drink alcohol if: Your health care provider tells you not to drink. You are pregnant, may be pregnant, or are planning to become pregnant. If you drink alcohol: Limit how much you have to 0-1 drink a day. Be aware of how much alcohol is in your drink. In the [place] one drink equals one 12 oz bottle of beer (355 mL), one 5 oz glass of wine (148 mL), or one 1 oz glass of hard liquor (44 mL).  Lifestyle Take daily care of your teeth and gums. Brush your teeth every morning and night with fluoride toothpaste. Floss one time each day. Stay active. Exercise for at least 30 minutes 5 or more days each week. Do not use any products that contain nicotine or tobacco, such as cigarettes, e-cigarettes, and chewing tobacco. If you need help quitting, ask your health care provider. Do not use drugs. If you are sexually active, practice safe sex. Use a condom or other form of protection to prevent STIs (sexually transmitted infections). If you do not wish to become pregnant, use a form of birth control. If you plan to become pregnant, see your health care   provider for a prepregnancy visit. Find healthy ways to cope with stress, such as: Meditation, yoga, or listening to music. Journaling. Talking to a trusted person. Spending time with friends and family. Safety Always wear your seat belt while driving or riding in a  vehicle. Do not drive: If you have been drinking alcohol. Do not ride with someone who has been drinking. When you are tired or distracted. While texting. Wear a helmet and other protective equipment during sports activities. If you have firearms in your house, make sure you follow all gun safety procedures. Seek help if you have been physically or sexually abused. What's next? Go to your health care provider once a year for an annual wellness visit. Ask your health care provider how often you should have your eyes and teeth checked. Stay up to date on all vaccines. This information is not intended to replace advice given to you by your health care provider. Make sure you discuss any questions you have with your healthcare provider. Document Revised: 04/01/2020 Document Reviewed: 04/15/2018 Elsevier Patient Education  2022 [employer].

## 2021-04-03 NOTE — Progress Notes (Signed)
GYNECOLOGY ANNUAL PREVENTATIVE CARE ENCOUNTER NOTE  History:     Rajvi Armentor is a 30 y.o. G3P3 female here for a routine annual gynecologic exam.  Current complaints: difficulty losing weight.   Denies abnormal vaginal bleeding, discharge, pelvic pain, problems with intercourse or other gynecologic concerns.     Social Relationship:separated (dating) Living: with her children Work: 2 jobs Midwife) Exercise: work lots of walking  Smoke/Alcohol/drug use: rare alcohol use  Gynecologic History Patient's last menstrual period was 03/25/2021 (exact date). Contraception: tubal ligation Last Pap: 02/11/2017. Results were: normal  Last mammogram: n/a.   Upstream - 04/03/21 1026       Pregnancy Intention Screening   Does the patient want to become pregnant in the next year? No    Does the patient's partner want to become pregnant in the next year? Unsure    Would the patient like to discuss contraceptive options today? No      Contraception Wrap Up   Current Method Female Sterilization    End Method Female Sterilization    Contraception Counseling Provided No            The pregnancy intention screening data noted above was reviewed. Potential methods of contraception were discussed. The patient elected to proceed with Female Sterilization.   Obstetric History OB History  Gravida Para Term Preterm AB Living  3 3 1 2   3   SAB IAB Ectopic Multiple Live Births          3    # Outcome Date GA Lbr Len/2nd Weight Sex Delivery Anes PTL Lv  3 Term 2016    M Vag-Spont   LIV  2 Preterm 2014    M Vag-Spont   LIV  1 Preterm 2011    F Vag-Spont   LIV    Past Medical History:  Diagnosis Date   ADHD (attention deficit hyperactivity disorder)    Asthma     Past Surgical History:  Procedure Laterality Date   TUBAL LIGATION  2016    No current outpatient medications on file prior to visit.   No current facility-administered medications on file prior to  visit.    No Known Allergies  Social History:  reports that she has never smoked. She has never used smokeless tobacco. She reports current alcohol use of about 1.0 standard drink per week. She reports that she does not use drugs.  Family History  Problem Relation Age of Onset   Diabetes Maternal Grandmother    Hypertension Neg Hx    Thyroid disease Neg Hx    Cancer Neg Hx     The following portions of the patient's history were reviewed and updated as appropriate: allergies, current medications, past family history, past medical history, past social history, past surgical history and problem list.  Review of Systems Pertinent items noted in HPI and remainder of comprehensive ROS otherwise negative.  Physical Exam:  BP 111/68   Pulse 86   Ht 5\' 4"  (1.626 m)   Wt 223 lb (101.2 kg)   LMP 03/25/2021 (Exact Date)   BMI 38.28 kg/m  CONSTITUTIONAL: Well-developed, well-nourished female in no acute distress.  HENT:  Normocephalic, atraumatic, External right and left ear normal. Oropharynx is clear and moist EYES: Conjunctivae and EOM are normal. Pupils are equal, round, and reactive to light. No scleral icterus.  NECK: Normal range of motion, supple, no masses.  Normal thyroid.  SKIN: Skin is warm and dry. No rash noted.  Not diaphoretic. No erythema. No pallor. MUSCULOSKELETAL: Normal range of motion. No tenderness.  No cyanosis, clubbing, or edema.  2+ distal pulses. NEUROLOGIC: Alert and oriented to person, place, and time. Normal reflexes, muscle tone coordination.  PSYCHIATRIC: Normal mood and affect. Normal behavior. Normal judgment and thought content. CARDIOVASCULAR: Normal heart rate noted, regular rhythm RESPIRATORY: Clear to auscultation bilaterally. Effort and breath sounds normal, no problems with respiration noted. BREASTS: Symmetric in size. No masses, tenderness, skin changes, nipple drainage, or lymphadenopathy bilaterally.  ABDOMEN: Soft, no distention noted.  No  tenderness, rebound or guarding.  PELVIC: Normal appearing external genitalia and urethral meatus; normal appearing vaginal mucosa and cervix.  No abnormal discharge noted.  Pap smear obtained.  Normal uterine size, no other palpable masses, no uterine or adnexal tenderness.  .   Assessment and Plan:    1. Women's annual routine gynecological examination  Pap:Will follow up results of pap smear and manage accordingly. Mammogram :n/a  Labs: STD testing, hem A1c, Tsh, CMP , vaginal swab Refills: none Referral: none Routine preventative health maintenance measures emphasized. Please refer to After Visit Summary for other counseling recommendations.    Follow up for weight loss visit .   Doreene Burke, CNM [facility],  [facility]

## 2021-04-04 LAB — COMPREHENSIVE METABOLIC PANEL
ALT: 11 IU/L (ref 0–32)
AST: 14 IU/L (ref 0–40)
Albumin/Globulin Ratio: 1.6 (ref 1.2–2.2)
Albumin: 4.3 g/dL (ref 3.9–5.0)
Alkaline Phosphatase: 92 IU/L (ref 44–121)
BUN/Creatinine Ratio: 15 (ref 9–23)
BUN: 9 mg/dL (ref 6–20)
Bilirubin Total: 0.3 mg/dL (ref 0.0–1.2)
CO2: 20 mmol/L (ref 20–29)
Calcium: 9.2 mg/dL (ref 8.7–10.2)
Chloride: 107 mmol/L — ABNORMAL HIGH (ref 96–106)
Creatinine, Ser: 0.62 mg/dL (ref 0.57–1.00)
Globulin, Total: 2.7 g/dL (ref 1.5–4.5)
Glucose: 80 mg/dL (ref 65–99)
Potassium: 4.2 mmol/L (ref 3.5–5.2)
Sodium: 141 mmol/L (ref 134–144)
Total Protein: 7 g/dL (ref 6.0–8.5)
eGFR: 124 mL/min/{1.73_m2} (ref >59)

## 2021-04-04 LAB — THYROID PANEL WITH TSH
Free Thyroxine Index: 1.8 (ref 1.2–4.9)
T3 Uptake Ratio: 27 % (ref 24–39)
T4, Total: 6.8 ug/dL (ref 4.5–12.0)
TSH: 1.93 u[IU]/mL (ref 0.450–4.500)

## 2021-04-04 LAB — CERVICOVAGINAL ANCILLARY ONLY
Bacterial Vaginitis (gardnerella): NEGATIVE
Candida Glabrata: NEGATIVE
Candida Vaginitis: POSITIVE — AB
Chlamydia: NEGATIVE
Comment: NEGATIVE
Comment: NEGATIVE
Comment: NEGATIVE
Comment: NEGATIVE
Comment: NEGATIVE
Comment: NORMAL
Neisseria Gonorrhea: NEGATIVE
Trichomonas: NEGATIVE

## 2021-04-04 LAB — HEMOGLOBIN A1C
Est. average glucose Bld gHb Est-mCnc: 97 mg/dL
Hgb A1c MFr Bld: 5 % (ref 4.8–5.6)

## 2021-04-04 LAB — RPR: RPR Ser Ql: NONREACTIVE

## 2021-04-04 LAB — HEPATITIS B SURFACE ANTIGEN: Hepatitis B Surface Ag: NEGATIVE

## 2021-04-04 LAB — HSV(HERPES SIMPLEX VRS) I + II AB-IGG
HSV 1 Glycoprotein G Ab, IgG: 58.4 index — ABNORMAL HIGH (ref 0.00–0.90)
HSV 2 IgG, Type Spec: <0.91 index (ref 0.00–0.90)

## 2021-04-04 LAB — HIV ANTIBODY (ROUTINE TESTING W REFLEX): HIV Screen 4th Generation wRfx: NONREACTIVE

## 2021-04-04 LAB — HEPATITIS C ANTIBODY: Hep C Virus Ab: <0.1 s/co ratio (ref 0.0–0.9)

## 2021-04-05 ENCOUNTER — Telehealth: Payer: Self-pay | Admitting: Certified Nurse Midwife

## 2021-04-05 ENCOUNTER — Other Ambulatory Visit: Payer: Self-pay | Admitting: Certified Nurse Midwife

## 2021-04-05 LAB — URINE CULTURE

## 2021-04-05 MED ORDER — FLUCONAZOLE 150 MG PO TABS: 150.0000 mg | ORAL_TABLET | Freq: Once | ORAL | 0 refills | Status: AC

## 2021-04-05 NOTE — Progress Notes (Signed)
Spoke with patient, voiced no further concerns or questions.

## 2021-04-05 NOTE — Progress Notes (Signed)
LVM with patient instructing to call back.  

## 2021-04-05 NOTE — Telephone Encounter (Signed)
Pt called with questions about [facility]- stating that she meds meds sent in, made her aware diflucan was sent to pharmacy- discovered she is now using [facility] in [place], made pt aware that we will update her pharmacy in her chart. Made her aware that a nurse will call he back to review her labs with her.

## 2021-04-08 LAB — CYTOLOGY - PAP
Comment: NEGATIVE
Diagnosis: UNDETERMINED — AB
High risk HPV: NEGATIVE

## 2021-05-07 ENCOUNTER — Encounter: Payer: Self-pay | Admitting: Certified Nurse Midwife

## 2021-05-07 ENCOUNTER — Ambulatory Visit (INDEPENDENT_AMBULATORY_CARE_PROVIDER_SITE_OTHER): Payer: 59 | Admitting: Certified Nurse Midwife

## 2021-05-07 ENCOUNTER — Other Ambulatory Visit: Payer: Self-pay

## 2021-05-07 VITALS — BP 118/70 | HR 89 | Ht 64.0 in | Wt 224.4 lb

## 2021-05-07 DIAGNOSIS — Z7689 Persons encountering health services in other specified circumstances: Secondary | ICD-10-CM

## 2021-05-07 MED ORDER — CYANOCOBALAMIN 1000 MCG/ML IJ SOLN
1000.0000 ug | Freq: Once | INTRAMUSCULAR | Status: AC
  Administered 2021-05-07: 1000 ug via INTRAMUSCULAR

## 2021-05-07 MED ORDER — PHENTERMINE HCL 37.5 MG PO TABS: 37.5000 mg | ORAL_TABLET | Freq: Every day | ORAL | 0 refills | Status: DC

## 2021-05-07 NOTE — Patient Instructions (Signed)

## 2021-05-07 NOTE — Progress Notes (Signed)
Subjective:  Shawniece Oyola is a 30 y.o. [phone] at Unknown being seen today for weight loss management- initial visit.  Patient reports General ROS: negative and reports previous weight loss attempts: none  Onset was gradual over the past several years Onset followed:  3 pregnancies and a failed marriage Associated symptoms include: fatigue, depression, anxiety,  change in clothing fit.  Current treatment plan: small frequent feedings,  gluten free diet, vitamin B-12 injections and appetite stimulant.  Pertinent medical history includes: history of ADHD  Risk factors include: family work obligations that may inhibit regular exercise.   The patient has a surgical history of: BTL Pertinent social history includes: none  Past evaluation has included: metabolic profile, hemoglobin A1c, thyroid panel   The following portions of the patient's history were reviewed and updated as appropriate: allergies, current medications, past family history, past medical history, past social history, past surgical history and problem list.   Objective:   Vitals:   05/07/21 0933  BP: 118/70  Pulse: 89  Weight: 224 lb 6.4 oz (101.8 kg)  Height: 5\' 4"  (1.626 m)  Waist :39.25 in  General:  Alert, oriented and cooperative. Patient is in no acute distress.  PE: Well groomed female in no current distress,   Mental Status: Normal mood and affect. Normal behavior. Normal judgment and thought content.   Current BMI: Body mass index is 38.52 kg/m.   Assessment and Plan:  Obesity Controlled substance contract signed PMP aware web site show no history on file   RX for adipex 37.5 mg daily and B12 .ml monthly, to start now with first injection given at today's visit. Reviewed side-effects common to both medications and expected outcomes. Increase daily water intake to at least 8 bottle a day, every day.  Goal is to reduse weight by 5% ( 11 lbs 2 oz)  by end of three months, and will re-evaluate  then.  Discussed diet and exercise. She will start by exercising 3-4 wk for 30-40 min.  Dicussed use of app to monitor her intake, water , and exercise.  RTC in 4 weeks for visit to check weight & BP, and get next B12 injections.   Please refer to After Visit Summary for other counseling recommendations.    , CNM    Consider the Low Glycemic Index Diet and 6 smaller meals daily .  This boosts your metabolism and regulates your sugars:   Use the protein bar by Atkins because they have lots of fiber in them  Find the low carb flatbreads, tortillas and pita breads for sandwiches:  Joseph's makes a pita bread and a flat bread , available at [facility] and [employer]; Toufayah makes a low carb flatbread available at [facility] and HT that is 9 net carbs and 100 cal Mission makes a low carb whole wheat tortilla available at [employer] most grocery stores with 6 net carbs and 210 cal  [employer] yogurt can still have a lot of carbs .  Dannon Light N fit has 80 cal and 8 carbs

## 2021-06-04 ENCOUNTER — Other Ambulatory Visit: Payer: Self-pay

## 2021-06-04 ENCOUNTER — Ambulatory Visit (INDEPENDENT_AMBULATORY_CARE_PROVIDER_SITE_OTHER): Payer: 59 | Admitting: Certified Nurse Midwife

## 2021-06-04 ENCOUNTER — Encounter: Payer: Self-pay | Admitting: Certified Nurse Midwife

## 2021-06-04 VITALS — BP 114/76 | HR 93 | Ht 64.0 in | Wt 213.4 lb

## 2021-06-04 DIAGNOSIS — Z7689 Persons encountering health services in other specified circumstances: Secondary | ICD-10-CM

## 2021-06-04 MED ORDER — CYANOCOBALAMIN 1000 MCG/ML IJ SOLN: 1000.0000 ug | Freq: Once | INTRAMUSCULAR | 5 refills | Status: AC

## 2021-06-04 MED ORDER — CYANOCOBALAMIN 1000 MCG/ML IJ SOLN
1000.0000 ug | Freq: Once | INTRAMUSCULAR | Status: AC
  Administered 2021-06-04: 1000 ug via INTRAMUSCULAR

## 2021-06-04 MED ORDER — PHENTERMINE HCL 37.5 MG PO TABS: 37.5000 mg | ORAL_TABLET | Freq: Every day | ORAL | 0 refills | Status: DC

## 2021-06-04 NOTE — Patient Instructions (Signed)

## 2021-06-04 NOTE — Progress Notes (Signed)
SUBJECTIVE:  30 y.o. here for follow-up weight loss visit, previously seen 4 weeks ago. Denies any concerns and feels like medication is working well, She denies any issues with it. She has lost 11 lbs this past month.   OBJECTIVE:  BP 114/76   Pulse 93   Ht 5\' 4"  (1.626 m)   Wt 213 lb 6.4 oz (96.8 kg)   BMI 36.63 kg/m   Body mass index is 36.63 kg/m. Waist: 36.5 in  Patient appears well. ASSESSMENT:  Obesity- responding well to weight loss plan PLAN:  To continue with current medications. B12 1059mcg/ml injection given RTC in 4 weeks as planned  80m, CNM

## 2021-07-01 ENCOUNTER — Other Ambulatory Visit: Payer: Self-pay

## 2021-07-01 ENCOUNTER — Ambulatory Visit (INDEPENDENT_AMBULATORY_CARE_PROVIDER_SITE_OTHER): Payer: 59 | Admitting: Certified Nurse Midwife

## 2021-07-01 ENCOUNTER — Encounter: Payer: Self-pay | Admitting: Certified Nurse Midwife

## 2021-07-01 VITALS — BP 110/74 | HR 105 | Ht 64.0 in | Wt 209.6 lb

## 2021-07-01 DIAGNOSIS — Z7689 Persons encountering health services in other specified circumstances: Secondary | ICD-10-CM

## 2021-07-01 MED ORDER — CYANOCOBALAMIN 1000 MCG/ML IJ SOLN: 1000.0000 ug | Freq: Once | INTRAMUSCULAR | 5 refills | Status: AC

## 2021-07-01 MED ORDER — PHENTERMINE HCL 37.5 MG PO TABS: 37.5000 mg | ORAL_TABLET | Freq: Every day | ORAL | 0 refills | Status: DC

## 2021-07-01 MED ORDER — CYANOCOBALAMIN 1000 MCG/ML IJ SOLN
1000.0000 ug | Freq: Once | INTRAMUSCULAR | Status: AC
  Administered 2021-07-01: 1000 ug via INTRAMUSCULAR

## 2021-07-01 NOTE — Patient Instructions (Signed)

## 2021-07-01 NOTE — Progress Notes (Signed)
SUBJECTIVE:  30 y.o. here for follow-up weight loss visit, previously seen 4 weeks ago. Denies any concerns and feels like medication is working well, She denies any negative side effects.   OBJECTIVE:  BP 110/74   Pulse (!) 105   Ht 5\' 4"  (1.626 m)   Wt 209 lb 9.6 oz (95.1 kg)   BMI 35.98 kg/m   Body mass index is 35.98 kg/m. Waist:38.75 ( pt wearing big sweater.   Patient appears well. ASSESSMENT:  Obesity- responding well to weight loss plan PLAN:  To continue with current medications. B12 1070mcg/ml injection given RTC in 4 weeks as planned  80m, CNM

## 2021-08-14 ENCOUNTER — Encounter: Payer: Self-pay | Admitting: Certified Nurse Midwife

## 2021-08-14 ENCOUNTER — Ambulatory Visit (INDEPENDENT_AMBULATORY_CARE_PROVIDER_SITE_OTHER): Payer: 59 | Admitting: Certified Nurse Midwife

## 2021-08-14 ENCOUNTER — Other Ambulatory Visit: Payer: Self-pay

## 2021-08-14 VITALS — BP 114/78 | HR 92 | Ht 64.0 in | Wt 202.2 lb

## 2021-08-14 DIAGNOSIS — Z7689 Persons encountering health services in other specified circumstances: Secondary | ICD-10-CM

## 2021-08-14 MED ORDER — CYANOCOBALAMIN 1000 MCG/ML IJ SOLN
1000.0000 ug | Freq: Once | INTRAMUSCULAR | Status: AC
Start: 2021-08-14 — End: 2021-08-14
  Administered 2021-08-14: 12:00:00 1000 ug via INTRAMUSCULAR

## 2021-08-14 MED ORDER — PHENTERMINE HCL 37.5 MG PO TABS: 37.5000 mg | ORAL_TABLET | Freq: Every day | ORAL | 0 refills | Status: DC

## 2021-08-14 NOTE — Progress Notes (Signed)
SUBJECTIVE:  30 y.o. here for follow-up weight loss visit, previously seen 4 weeks ago. Denies any concerns and feels like medication is working well. She denies any complications with use. She lost 7 lbs this past month.  OBJECTIVE:  BP 114/78    Pulse 92    Ht 5\' 4"  (1.626 m)    Wt 202 lb 3.2 oz (91.7 kg)    LMP  (LMP Unknown)    BMI 34.71 kg/m   Body mass index is 34.71 kg/m.  Waist 36 in  Patient appears well. ASSESSMENT:  Obesity- responding well to weight loss plan PLAN:  To continue with current medications. B12 1071mcg/ml injection given RTC in 4 weeks as planned  80m, CNM

## 2021-08-14 NOTE — Patient Instructions (Signed)

## 2021-09-16 ENCOUNTER — Ambulatory Visit: Payer: Self-pay | Admitting: Certified Nurse Midwife

## 2021-09-23 ENCOUNTER — Ambulatory Visit (INDEPENDENT_AMBULATORY_CARE_PROVIDER_SITE_OTHER): Payer: BC Managed Care – PPO | Admitting: Certified Nurse Midwife

## 2021-09-23 ENCOUNTER — Other Ambulatory Visit: Payer: Self-pay

## 2021-09-23 ENCOUNTER — Encounter: Payer: Self-pay | Admitting: Certified Nurse Midwife

## 2021-09-23 VITALS — BP 113/72 | HR 102 | Ht 64.0 in | Wt 196.9 lb

## 2021-09-23 DIAGNOSIS — Z7689 Persons encountering health services in other specified circumstances: Secondary | ICD-10-CM

## 2021-09-23 MED ORDER — PHENTERMINE HCL 37.5 MG PO TABS: 37.5000 mg | ORAL_TABLET | Freq: Every day | ORAL | 0 refills | Status: DC

## 2021-09-23 MED ORDER — CYANOCOBALAMIN 1000 MCG/ML IJ SOLN
1000.0000 ug | Freq: Once | INTRAMUSCULAR | Status: AC
  Administered 2021-09-23: 1000 ug via INTRAMUSCULAR

## 2021-09-23 NOTE — Progress Notes (Signed)
SUBJECTIVE:  31 y.o. here for follow-up weight loss visit, previously seen 4 weeks ago. Denies any concerns and feels like medication is working well. She denies any issues. She lost 6 lbs this past month  OBJECTIVE:  BP 113/72    Pulse (!) 102    Ht 5\' 4"  (1.626 m)    Wt 196 lb 14.4 oz (89.3 kg)    LMP  (LMP Unknown)    BMI 33.80 kg/m   Body mass index is 33.8 kg/m. Waist 34.5 in  Patient appears well. ASSESSMENT:  Obesity- responding well to weight loss plan PLAN:  To continue with current medications. B12 1016mcg/ml injection given RTC in 4 weeks as planned  80m, CNM

## 2021-09-23 NOTE — Patient Instructions (Signed)

## 2021-10-06 ENCOUNTER — Other Ambulatory Visit: Payer: Self-pay | Admitting: Certified Nurse Midwife

## 2021-10-07 NOTE — Telephone Encounter (Signed)
This was called in on Feb. 6. Patient did not pick up from pharmacy. Called patient to inform her to go pick up medication.

## 2021-10-30 ENCOUNTER — Encounter: Payer: Self-pay | Admitting: Certified Nurse Midwife

## 2021-10-30 ENCOUNTER — Ambulatory Visit (INDEPENDENT_AMBULATORY_CARE_PROVIDER_SITE_OTHER): Payer: BC Managed Care – PPO | Admitting: Certified Nurse Midwife

## 2021-10-30 ENCOUNTER — Other Ambulatory Visit: Payer: Self-pay

## 2021-10-30 VITALS — BP 108/70 | HR 89 | Ht 64.0 in | Wt 199.9 lb

## 2021-10-30 DIAGNOSIS — Z7689 Persons encountering health services in other specified circumstances: Secondary | ICD-10-CM | POA: Diagnosis not present

## 2021-10-30 MED ORDER — CYANOCOBALAMIN 1000 MCG/ML IJ SOLN
1000.0000 ug | Freq: Once | INTRAMUSCULAR | Status: AC
  Administered 2021-10-30: 1000 ug via INTRAMUSCULAR

## 2021-10-30 NOTE — Progress Notes (Signed)
WAIST 37 IN ?BMI 34.31 ?

## 2021-10-30 NOTE — Progress Notes (Signed)
SUBJECTIVE:  31 y.o. here for follow-up weight loss visit, previously seen 4 weeks ago. Denies any concerns and feels like medication is working but she had episode 2 wks ago of elevated heart rate 160s. She Went to hospital and was found to be anemic and dehydration requiring a blood transfusion and IV fluid infusion. She was told by Md to hold medication for the time being given HR elevation could be due to anemia or medication. She was seen by cardiologist and told she has right bundle branch blockages but that is not a significant finding and that "she did not need to worry about it". Since being off of medication she gained 3 lbs.  ? ?OBJECTIVE:  BP 108/70   Pulse 89   Ht 5\' 4"  (1.626 m)   Wt 199 lb 14.4 oz (90.7 kg)   LMP  (LMP Unknown)   BMI 34.31 kg/m?   Body mass index is 34.31 kg/m?.  ? ?Patient appears well. Discussed starting medication again and monitoring HR 3 times daily over the next 3 days. She will send me HR over my chart. Discussed if constantly above 100 that we should stop medication and consider other options. She has some phentermine left since she stopped used 2 wks ago.  ?  ?ASSESSMENT:  Obesity- responding well to weight loss plan ?PLAN:  To continue with current medications. B12 1056mcg/ml injection given ?RTC in 4 weeks as planned ? ?80m, CNM  ?

## 2021-10-30 NOTE — Patient Instructions (Signed)

## 2021-11-27 ENCOUNTER — Encounter: Payer: BC Managed Care – PPO | Admitting: Certified Nurse Midwife

## 2021-12-04 ENCOUNTER — Encounter: Payer: Self-pay | Admitting: Certified Nurse Midwife

## 2021-12-04 ENCOUNTER — Ambulatory Visit (INDEPENDENT_AMBULATORY_CARE_PROVIDER_SITE_OTHER): Payer: BC Managed Care – PPO | Admitting: Certified Nurse Midwife

## 2021-12-04 VITALS — BP 124/83 | HR 86 | Ht 64.0 in | Wt 196.4 lb

## 2021-12-04 DIAGNOSIS — E669 Obesity, unspecified: Secondary | ICD-10-CM

## 2021-12-04 DIAGNOSIS — Z7689 Persons encountering health services in other specified circumstances: Secondary | ICD-10-CM | POA: Diagnosis not present

## 2021-12-04 MED ORDER — CYANOCOBALAMIN 1000 MCG/ML IJ SOLN
1000.0000 ug | Freq: Once | INTRAMUSCULAR | Status: AC
  Administered 2021-12-04: 1000 ug via INTRAMUSCULAR

## 2021-12-04 MED ORDER — PHENTERMINE HCL 37.5 MG PO TABS: 37.5000 mg | ORAL_TABLET | Freq: Every day | ORAL | 0 refills | Status: DC

## 2021-12-04 MED ORDER — CYANOCOBALAMIN 1000 MCG/ML IJ SOLN: 1000.0000 ug | Freq: Once | INTRAMUSCULAR | 0 refills | Status: AC

## 2021-12-04 NOTE — Patient Instructions (Signed)

## 2021-12-04 NOTE — Progress Notes (Signed)
SUBJECTIVE:  31 y.o. here for follow-up weight loss visit, previously seen 4 weeks ago. Denies any concerns and feels like medication is working well, she denies any complaints with medication. She lost 3 lbs this past month.  ? ?OBJECTIVE:  BP 124/83   Pulse 86   Ht 5\' 4"  (1.626 m)   Wt 196 lb 6.4 oz (89.1 kg)   LMP 12/01/2021 (Exact Date)   BMI 33.71 kg/m?   Body mass index is 33.71 kg/m?04/18/2023Waist: 35.5 ? ? Patient appears well. ?ASSESSMENT:  Obesity- responding well to weight loss plan ?PLAN:  To continue with current medications. B12 1048mcg/ml injection given ?RTC in 4 weeks as planned ? ?80m, CNM , CNM  ?

## 2022-01-01 ENCOUNTER — Ambulatory Visit (INDEPENDENT_AMBULATORY_CARE_PROVIDER_SITE_OTHER): Payer: BC Managed Care – PPO | Admitting: Certified Nurse Midwife

## 2022-01-01 ENCOUNTER — Encounter: Payer: Self-pay | Admitting: Certified Nurse Midwife

## 2022-01-01 VITALS — BP 114/72 | HR 90 | Ht 64.0 in | Wt 193.0 lb

## 2022-01-01 DIAGNOSIS — Z7689 Persons encountering health services in other specified circumstances: Secondary | ICD-10-CM | POA: Diagnosis not present

## 2022-01-01 DIAGNOSIS — Z30430 Encounter for insertion of intrauterine contraceptive device: Secondary | ICD-10-CM

## 2022-01-01 DIAGNOSIS — Z3202 Encounter for pregnancy test, result negative: Secondary | ICD-10-CM | POA: Diagnosis not present

## 2022-01-01 LAB — POCT URINE PREGNANCY: Preg Test, Ur: NEGATIVE

## 2022-01-01 MED ORDER — PHENTERMINE HCL 37.5 MG PO TABS
37.5000 mg | ORAL_TABLET | Freq: Every day | ORAL | 0 refills | Status: DC
Start: 2022-01-01 — End: 2022-02-13

## 2022-01-01 MED ORDER — CYANOCOBALAMIN 1000 MCG/ML IJ SOLN
1000.0000 ug | Freq: Once | INTRAMUSCULAR | Status: AC
  Administered 2022-01-01: 1000 ug via INTRAMUSCULAR

## 2022-01-01 NOTE — Progress Notes (Signed)
SUBJECTIVE:  31 y.o. here for follow-up weight loss visit, previously seen 4 weeks ago. Denies any concerns and feels like medication is working well, She lose 3 lbs this past month.  ? ?OBJECTIVE:  BP 114/72   Pulse 90   Ht 5\' 4"  (1.626 m)   Wt 193 lb (87.5 kg)   LMP 12/26/2021 (Approximate)   BMI 33.13 kg/m?   Body mass index is 33.13 kg/m?02/25/2022 Waist: 34.25 in ? ?Patient appears well. ?ASSESSMENT:  Obesity- responding well to weight loss plan ?PLAN:  To continue with current medications. B12 1042mcg/ml injection given ?RTC in 4 weeks as planned ? ?80m, CNM  ?

## 2022-01-01 NOTE — Patient Instructions (Signed)

## 2022-01-01 NOTE — Addendum Note (Signed)
Addended by: Mechele Claude on: 01/01/2022 11:30 AM ? ? Modules accepted: Orders ? ?

## 2022-01-01 NOTE — Progress Notes (Signed)
? ?  GYNECOLOGY OFFICE PROCEDURE NOTE ? ?Jacey Eckerson is a 31 y.o. [phone] here for Mirena IUD insertion. No GYN concerns. Is getting placed due to heavy periods causing anemia.  Last pap smear was on 04/03/2021 and was ASCUS ? ?IUD Insertion Procedure Note ?Patient identified, informed consent performed, consent signed.   Discussed risks of irregular bleeding, cramping, infection, malpositioning or misplacement of the IUD outside the uterus which may require further procedure such as laparoscopy. Also discussed >99% contraception efficacy, increased risk of ectopic pregnancy with failure of method.   Emphasized that this did not protect against STIs, condoms recommended during all sexual encounters. Time out was performed.  Urine pregnancy test negative. ? ?Speculum placed in the vagina.  Cervix visualized.  Cleaned with Betadine x 2.  Grasped anteriorly with a single tooth tenaculum.  Uterus sounded to 8 cm.  Mirena IUD placed per manufacturer's recommendations.  Strings trimmed to 3 cm. Tenaculum was removed, good hemostasis noted.  Patient tolerated procedure well.  ? ?Patient was given post-procedure instructions.  She was advised to have backup contraception for one week.  Patient was also asked to check IUD strings periodically and follow up in 4 weeks for IUD check. ? ?Doreene Burke, CNM  ?

## 2022-01-03 ENCOUNTER — Encounter: Payer: BC Managed Care – PPO | Admitting: Certified Nurse Midwife

## 2022-01-09 ENCOUNTER — Encounter: Payer: Self-pay | Admitting: Certified Nurse Midwife

## 2022-01-28 ENCOUNTER — Encounter: Payer: BC Managed Care – PPO | Admitting: Certified Nurse Midwife

## 2022-02-13 ENCOUNTER — Ambulatory Visit (INDEPENDENT_AMBULATORY_CARE_PROVIDER_SITE_OTHER): Payer: BC Managed Care – PPO | Admitting: Certified Nurse Midwife

## 2022-02-13 ENCOUNTER — Encounter: Payer: Self-pay | Admitting: Certified Nurse Midwife

## 2022-02-13 VITALS — BP 110/74 | HR 90 | Ht 66.0 in | Wt 194.1 lb

## 2022-02-13 DIAGNOSIS — Z7689 Persons encountering health services in other specified circumstances: Secondary | ICD-10-CM | POA: Diagnosis not present

## 2022-02-13 MED ORDER — PHENTERMINE HCL 37.5 MG PO TABS: 37.5000 mg | ORAL_TABLET | Freq: Every day | ORAL | 0 refills | Status: DC

## 2022-02-13 MED ORDER — CYANOCOBALAMIN 1000 MCG/ML IJ SOLN: 1000.0000 ug | Freq: Once | INTRAMUSCULAR | 0 refills | Status: DC

## 2022-02-13 NOTE — Patient Instructions (Signed)

## 2022-02-13 NOTE — Progress Notes (Signed)
SUBJECTIVE:  31 y.o. here for follow-up weight loss visit, previously seen 4 weeks ago. Denies any concerns and feels like medication is working. She denies any issue with chest pain, sob, heart racing. She did gain 1 lbs this past month, but of note pt got IUD placed. She denies any change in her eating or exercise routine. Discussed increase work outs by 10 min . Watching diet to continue calorie deficient.   OBJECTIVE:  BP 110/74   Pulse 90   Ht 5\' 6"  (1.676 m)   Wt 194 lb 1.6 oz (88 kg)   LMP  (LMP Unknown)   BMI 31.33 kg/m   Body mass index is 31.33 kg/m. Waist: 35.5  Patient appears well. ASSESSMENT:  Obesity- responding well to weight loss plan PLAN:  To continue with current medications. B12 1034mcg/ml injection not given today. Pt did not bring with her and we do not have instock. Pt instructed to return for nurse visit to administer prn.  RTC in 4 weeks as planned  80m, CNM

## 2022-02-13 NOTE — Progress Notes (Signed)
  GYNECOLOGY OFFICE ENCOUNTER NOTE  History:  31 y.o. [id number] here today for today for IUD string check; Mirena  IUD was placed  01/01/2022. No complaints about the IUD, no concerning side effects. She has had bleeding daily since placement but has not been heavy. She denies pain.   The following portions of the patient's history were reviewed and updated as appropriate: allergies, current medications, past family history, past medical history, past social history, past surgical history and problem list.   Review of Systems:  Pertinent items are noted in HPI.   Objective:  Physical Exam Blood pressure 110/74, pulse 90, height 5\' 6"  (1.676 m), weight 194 lb 1.6 oz (88 kg). CONSTITUTIONAL: Well-developed, well-nourished female in no acute distress.  NEUROLOGIC: Alert and oriented to person, place, and time. Normal reflexes, muscle tone coordination.  PSYCHIATRIC: Normal mood and affect. Normal behavior. Normal judgment and thought content. CARDIOVASCULAR: Normal heart rate noted RESPIRATORY: Effort and breath sounds normal, no problems with respiration noted ABDOMEN: Soft, no distention noted.   PELVIC: Normal appearing external genitalia; normal appearing vaginal mucosa and cervix.  IUD strings visualized, about 3 cm in length outside cervix. Done in the presence of a chaperone.   Assessment & Plan:  Patient to keep IUD in place for up to eight   , CNM

## 2022-03-08 ENCOUNTER — Other Ambulatory Visit: Payer: Self-pay | Admitting: Certified Nurse Midwife

## 2022-03-08 DIAGNOSIS — Z7689 Persons encountering health services in other specified circumstances: Secondary | ICD-10-CM

## 2022-03-13 ENCOUNTER — Encounter: Payer: Self-pay | Admitting: Certified Nurse Midwife

## 2022-03-13 ENCOUNTER — Ambulatory Visit (INDEPENDENT_AMBULATORY_CARE_PROVIDER_SITE_OTHER): Payer: BC Managed Care – PPO | Admitting: Certified Nurse Midwife

## 2022-03-13 VITALS — BP 118/75 | HR 97 | Ht 66.0 in | Wt 194.5 lb

## 2022-03-13 DIAGNOSIS — Z7689 Persons encountering health services in other specified circumstances: Secondary | ICD-10-CM | POA: Diagnosis not present

## 2022-03-13 MED ORDER — CYANOCOBALAMIN 1000 MCG/ML IJ SOLN
1000.0000 ug | Freq: Once | INTRAMUSCULAR | Status: AC
  Administered 2022-03-13: 1000 ug via INTRAMUSCULAR

## 2022-03-13 MED ORDER — CYANOCOBALAMIN 1000 MCG/ML IJ SOLN: 1000.0000 ug | Freq: Once | INTRAMUSCULAR | 5 refills | Status: AC

## 2022-03-13 MED ORDER — PHENTERMINE HCL 37.5 MG PO TABS: 37.5000 mg | ORAL_TABLET | Freq: Every day | ORAL | 0 refills | Status: DC

## 2022-03-13 NOTE — Progress Notes (Signed)
SUBJECTIVE:  31 y.o. here for follow-up weight loss visit, previously seen 4 weeks ago. Denies any concerns and feels like medication is not as effective . She denies any issue with side effects . She did not lose any weight this past month. She state she has been very stressed. Discussed cessation of medication should she not lose weight next visit. She verbalizes and agrees to plan. Encouraged strict food monitoring.   OBJECTIVE:  BP 118/75   Pulse 97   Ht 5\' 6"  (1.676 m)   Wt 194 lb 8 oz (88.2 kg)   LMP  (LMP Unknown)   BMI 31.39 kg/m   Body mass index is 31.39 kg/m.  Waist: 36 in  Patient appears well. ASSESSMENT:  Obesity- responding well to weight loss plan PLAN:  To continue with current medications. B12 1021mcg/ml injection given RTC in 4 weeks as planned  80m, CNM

## 2022-03-13 NOTE — Patient Instructions (Signed)

## 2022-04-08 ENCOUNTER — Encounter: Payer: 59 | Admitting: Certified Nurse Midwife

## 2022-04-11 ENCOUNTER — Other Ambulatory Visit: Payer: Self-pay | Admitting: Certified Nurse Midwife

## 2022-04-11 DIAGNOSIS — Z7689 Persons encountering health services in other specified circumstances: Secondary | ICD-10-CM

## 2022-04-23 ENCOUNTER — Ambulatory Visit (INDEPENDENT_AMBULATORY_CARE_PROVIDER_SITE_OTHER): Payer: BC Managed Care – PPO | Admitting: Certified Nurse Midwife

## 2022-04-23 ENCOUNTER — Encounter: Payer: Self-pay | Admitting: Certified Nurse Midwife

## 2022-04-23 VITALS — BP 134/83 | Ht 66.0 in | Wt 194.0 lb

## 2022-04-23 DIAGNOSIS — Z7689 Persons encountering health services in other specified circumstances: Secondary | ICD-10-CM | POA: Diagnosis not present

## 2022-04-23 MED ORDER — CYANOCOBALAMIN 1000 MCG/ML IJ SOLN
1000.0000 ug | Freq: Once | INTRAMUSCULAR | Status: AC
  Administered 2022-04-23: 1000 ug via INTRAMUSCULAR

## 2022-04-23 NOTE — Progress Notes (Signed)
SUBJECTIVE:  31 y.o. here for follow-up weight loss visit, previously seen 4 weeks ago. Denies any concerns and feels like medication is has worked well for her. She has not lost any weight since our last visit. She started this program 05/07/2021 thus I am recommend cessation of medication use. Pt is encouraged to continue to exercise and manager her diet to meet her goal weight.   OBJECTIVE:  BP 134/83   Ht 5\' 6"  (1.676 m)   Wt 194 lb (88 kg)   LMP 03/24/2022 (Approximate)   BMI 31.31 kg/m   Body mass index is 31.31 kg/m. Waist 37 in.   Patient appears well. ASSESSMENT:  Obesity- responding well to weight loss plan PLAN:  . B12 1053mcg/ml injection given, no prescription for phentermine .  RTC prn for annual exam   80m, CNM

## 2022-04-23 NOTE — Patient Instructions (Signed)
Calorie Counting for Weight Loss Calories are units of energy. Your body needs a certain number of calories from food to keep going throughout the day. When you eat or drink more calories than your body needs, your body stores the extra calories mostly as fat. When you eat or drink fewer calories than your body needs, your body burns fat to get the energy it needs. Calorie counting means keeping track of how many calories you eat and drink each day. Calorie counting can be helpful if you need to lose weight. If you eat fewer calories than your body needs, you should lose weight. Ask your health care provider what a healthy weight is for you. For calorie counting to work, you will need to eat the right number of calories each day to lose a healthy amount of weight per week. A dietitian can help you figure out how many calories you need in a day and will suggest ways to reach your calorie goal. A healthy amount of weight to lose each week is usually 1-2 lb (0.5-0.9 kg). This usually means that your daily calorie intake should be reduced by 500-750 calories. Eating 1,200-1,500 calories a day can help most women lose weight. Eating 1,500-1,800 calories a day can help most men lose weight. What do I need to know about calorie counting? Work with your health care provider or dietitian to determine how many calories you should get each day. To meet your daily calorie goal, you will need to: Find out how many calories are in each food that you would like to eat. Try to do this before you eat. Decide how much of the food you plan to eat. Keep a food log. Do this by writing down what you ate and how many calories it had. To successfully lose weight, it is important to balance calorie counting with a healthy lifestyle that includes regular activity. Where do I find calorie information?  The number of calories in a food can be found on a Nutrition Facts label. If a food does not have a Nutrition Facts label, try  to look up the calories online or ask your dietitian for help. Remember that calories are listed per serving. If you choose to have more than one serving of a food, you will have to multiply the calories per serving by the number of servings you plan to eat. For example, the label on a package of bread might say that a serving size is 1 slice and that there are 90 calories in a serving. If you eat 1 slice, you will have eaten 90 calories. If you eat 2 slices, you will have eaten 180 calories. How do I keep a food log? After each time that you eat, record the following in your food log as soon as possible: What you ate. Be sure to include toppings, sauces, and other extras on the food. How much you ate. This can be measured in cups, ounces, or number of items. How many calories were in each food and drink. The total number of calories in the food you ate. Keep your food log near you, such as in a pocket-sized notebook or on an app or website on your mobile phone. Some programs will calculate calories for you and show you how many calories you have left to meet your daily goal. What are some portion-control tips? Know how many calories are in a serving. This will help you know how many servings you can have of a certain   food. Use a measuring cup to measure serving sizes. You could also try weighing out portions on a kitchen scale. With time, you will be able to estimate serving sizes for some foods. Take time to put servings of different foods on your favorite plates or in your favorite bowls and cups so you know what a serving looks like. Try not to eat straight from a food's packaging, such as from a bag or box. Eating straight from the package makes it hard to see how much you are eating and can lead to overeating. Put the amount you would like to eat in a cup or on a plate to make sure you are eating the right portion. Use smaller plates, glasses, and bowls for smaller portions and to prevent  overeating. Try not to multitask. For example, avoid watching TV or using your computer while eating. If it is time to eat, sit down at a table and enjoy your food. This will help you recognize when you are full. It will also help you be more mindful of what and how much you are eating. What are tips for following this plan? Reading food labels Check the calorie count compared with the serving size. The serving size may be smaller than what you are used to eating. Check the source of the calories. Try to choose foods that are high in protein, fiber, and vitamins, and low in saturated fat, trans fat, and sodium. Shopping Read nutrition labels while you shop. This will help you make healthy decisions about which foods to buy. Pay attention to nutrition labels for low-fat or fat-free foods. These foods sometimes have the same number of calories or more calories than the full-fat versions. They also often have added sugar, starch, or salt to make up for flavor that was removed with the fat. Make a grocery list of lower-calorie foods and stick to it. Cooking Try to cook your favorite foods in a healthier way. For example, try baking instead of frying. Use low-fat dairy products. Meal planning Use more fruits and vegetables. One-half of your plate should be fruits and vegetables. Include lean proteins, such as chicken, turkey, and fish. Lifestyle Each week, aim to do one of the following: 150 minutes of moderate exercise, such as walking. 75 minutes of vigorous exercise, such as running. General information Know how many calories are in the foods you eat most often. This will help you calculate calorie counts faster. Find a way of tracking calories that works for you. Get creative. Try different apps or programs if writing down calories does not work for you. What foods should I eat?  Eat nutritious foods. It is better to have a nutritious, high-calorie food, such as an avocado, than a food with  few nutrients, such as a bag of potato chips. Use your calories on foods and drinks that will fill you up and will not leave you hungry soon after eating. Examples of foods that fill you up are nuts and nut butters, vegetables, lean proteins, and high-fiber foods such as whole grains. High-fiber foods are foods with more than 5 g of fiber per serving. Pay attention to calories in drinks. Low-calorie drinks include water and unsweetened drinks. The items listed above may not be a complete list of foods and beverages you can eat. Contact a dietitian for more information. What foods should I limit? Limit foods or drinks that are not good sources of vitamins, minerals, or protein or that are high in unhealthy fats. These   include: Candy. Other sweets. Sodas, specialty coffee drinks, alcohol, and juice. The items listed above may not be a complete list of foods and beverages you should avoid. Contact a dietitian for more information. How do I count calories when eating out? Pay attention to portions. Often, portions are much larger when eating out. Try these tips to keep portions smaller: Consider sharing a meal instead of getting your own. If you get your own meal, eat only half of it. Before you start eating, ask for a container and put half of your meal into it. When available, consider ordering smaller portions from the menu instead of full portions. Pay attention to your food and drink choices. Knowing the way food is cooked and what is included with the meal can help you eat fewer calories. If calories are listed on the menu, choose the lower-calorie options. Choose dishes that include vegetables, fruits, whole grains, low-fat dairy products, and lean proteins. Choose items that are boiled, broiled, grilled, or steamed. Avoid items that are buttered, battered, fried, or served with cream sauce. Items labeled as crispy are usually fried, unless stated otherwise. Choose water, low-fat milk,  unsweetened iced tea, or other drinks without added sugar. If you want an alcoholic beverage, choose a lower-calorie option, such as a glass of wine or light beer. Ask for dressings, sauces, and syrups on the side. These are usually high in calories, so you should limit the amount you eat. If you want a salad, choose a garden salad and ask for grilled meats. Avoid extra toppings such as bacon, cheese, or fried items. Ask for the dressing on the side, or ask for olive oil and vinegar or lemon to use as dressing. Estimate how many servings of a food you are given. Knowing serving sizes will help you be aware of how much food you are eating at restaurants. Where to find more information Centers for Disease Control and Prevention: www.cdc.gov [place] Department of Agriculture: myplate.gov Summary Calorie counting means keeping track of how many calories you eat and drink each day. If you eat fewer calories than your body needs, you should lose weight. A healthy amount of weight to lose per week is usually 1-2 lb (0.5-0.9 kg). This usually means reducing your daily calorie intake by 500-750 calories. The number of calories in a food can be found on a Nutrition Facts label. If a food does not have a Nutrition Facts label, try to look up the calories online or ask your dietitian for help. Use smaller plates, glasses, and bowls for smaller portions and to prevent overeating. Use your calories on foods and drinks that will fill you up and not leave you hungry shortly after a meal. This information is not intended to replace advice given to you by your health care provider. Make sure you discuss any questions you have with your health care provider. Document Revised: 09/15/2019 Document Reviewed: 09/15/2019 Elsevier Patient Education  2023 [employer]  

## 2022-08-27 ENCOUNTER — Ambulatory Visit: Payer: Self-pay | Admitting: Licensed Practical Nurse

## 2022-12-24 ENCOUNTER — Ambulatory Visit
Admission: EM | Admit: 2022-12-24 | Discharge: 2022-12-24 | Disposition: A | Discharge status: Home / Self Care | Payer: Medicaid Other | Attending: Internal Medicine | Admitting: Internal Medicine

## 2022-12-24 DIAGNOSIS — R350 Frequency of micturition: Secondary | ICD-10-CM | POA: Insufficient documentation

## 2022-12-24 DIAGNOSIS — Z113 Encounter for screening for infections with a predominantly sexual mode of transmission: Secondary | ICD-10-CM | POA: Insufficient documentation

## 2022-12-24 DIAGNOSIS — N3001 Acute cystitis with hematuria: Secondary | ICD-10-CM | POA: Insufficient documentation

## 2022-12-24 DIAGNOSIS — Z3202 Encounter for pregnancy test, result negative: Secondary | ICD-10-CM | POA: Diagnosis not present

## 2022-12-24 LAB — POCT URINALYSIS DIP (MANUAL ENTRY)
Bilirubin, UA: NEGATIVE
Glucose, UA: NEGATIVE mg/dL
Ketones, POC UA: NEGATIVE mg/dL
Nitrite, UA: NEGATIVE
Protein Ur, POC: NEGATIVE mg/dL
Spec Grav, UA: 1.025 (ref 1.010–1.025)
Urobilinogen, UA: 0.2 E.U./dL
pH, UA: 7 (ref 5.0–8.0)

## 2022-12-24 LAB — POCT URINE PREGNANCY: Preg Test, Ur: NEGATIVE

## 2022-12-24 MED ORDER — CEPHALEXIN 500 MG PO CAPS: 500.0000 mg | ORAL_CAPSULE | Freq: Two times a day (BID) | ORAL | 0 refills | Status: AC

## 2022-12-24 NOTE — ED Triage Notes (Signed)
Pt c/o abd pain after menstrual onset. Reports having hormonal implant usually has light cycles, now heavy. Associated frequency.   Onset ~ "within the last week"

## 2022-12-24 NOTE — Discharge Instructions (Addendum)
Urine pregnancy test was negative.  I am treating you for UTI with antibiotic.  Urine culture and vaginal swab are pending.  Will call if they are abnormal.  Please follow-up with gynecologist for your symptoms as well.

## 2022-12-24 NOTE — ED Provider Notes (Signed)
[name]    CSN: [id number] Arrival date & time: 12/24/22  0913      History   Chief Complaint Chief Complaint  Patient presents with   Abdominal Pain    HPI Virginia [name] is a 32 y.o. female.   Patient presents with abdominal pain and urinary frequency noted a few days ago but reports symptoms have been intermittent for "a while".  Patient reports that she is having some lower abdominal cramping that seems to radiate around to her back at times.  She was originally attributing symptoms to her IUD that is in place given that she has been having intermittent complications with abdominal pain and bleeding ever since it was placed approximately 1 year ago.  Reports that she typically has very light menstrual cycles but the last menstrual cycle that occurred approximately 1 week ago was a lot heavier.  She denies urinary burning, hematuria, vaginal discharge.  Denies exposure to STD but has had unprotected intercourse with a single sexual partner.  Reports that she has discussed the intermittent discomfort with IUD with her gynecologist who reports that "as long as she can feel the strings, then she should be fine".  Reports that she has been able to feel the string of the IUD.  Patient also reports that they were practicing urine dipsticks at her work a few months prior and she had blood in her urine at that time so is not sure if this is related.  Denies history of kidney stones.   Abdominal Pain   Past Medical History:  Diagnosis Date   ADHD (attention deficit hyperactivity disorder)    Asthma     Patient Active Problem List   Diagnosis Date Noted   Obesity (BMI 30-39.9) 12/04/2021    Past Surgical History:  Procedure Laterality Date   TUBAL LIGATION  2016    OB History     Gravida  3   Para  3   Term  1   Preterm  2   AB      Living  3      SAB      IAB      Ectopic      Multiple      Live Births  3            Home  Medications    Prior to Admission medications   Medication Sig Start Date End Date Taking? Authorizing Provider  cephALEXin (KEFLEX) 500 MG capsule Take 1 capsule (500 mg total) by mouth 2 (two) times daily for 7 days. 12/24/22 12/31/22 Yes Kinberly Perris, Acie Fredrickson, FNP  phentermine (ADIPEX-P) 37.5 MG tablet Take 1 tablet (37.5 mg total) by mouth daily before breakfast. 03/13/22   Doreene Burke, CNM    Family History Family History  Problem Relation Age of Onset   Diabetes Maternal Grandmother    Hypertension Neg Hx    Thyroid disease Neg Hx    Cancer Neg Hx     Social History Social History   Tobacco Use   Smoking status: Never   Smokeless tobacco: Never  Vaping Use   Vaping Use: Never used  Substance Use Topics   Alcohol use: Yes    Alcohol/week: 1.0 standard drink of alcohol    Types: 1 Glasses of wine per week    Comment: occas   Drug use: No     Allergies   Patient has no known allergies.   Review of Systems Review of Systems Per HPI  Physical Exam Triage Vital Signs ED Triage Vitals  Enc Vitals Group     BP 12/24/22 1023 110/75     Pulse Rate 12/24/22 1023 82     Resp 12/24/22 1023 18     Temp 12/24/22 1023 97.7 F (36.5 C)     Temp Source 12/24/22 1023 Oral     SpO2 12/24/22 1023 98 %     Weight --      Height --      Head Circumference --      Peak Flow --      Pain Score 12/24/22 1024 6     Pain Loc --      Pain Edu? --      Excl. in GC? --    No data found.  Updated Vital Signs BP 110/75 (BP Location: Right Arm)   Pulse 82   Temp 97.7 F (36.5 C) (Oral)   Resp 18   SpO2 98%   Visual Acuity Right Eye Distance:   Left Eye Distance:   Bilateral Distance:    Right Eye Near:   Left Eye Near:    Bilateral Near:     Physical Exam Constitutional:      General: She is not in acute distress.    Appearance: Normal appearance. She is not toxic-appearing or diaphoretic.  HENT:     Head: Normocephalic and atraumatic.  Eyes:     Extraocular  Movements: Extraocular movements intact.     Conjunctiva/sclera: Conjunctivae normal.  Cardiovascular:     Rate and Rhythm: Normal rate and regular rhythm.     Pulses: Normal pulses.     Heart sounds: Normal heart sounds.  Pulmonary:     Effort: Pulmonary effort is normal. No respiratory distress.     Breath sounds: Normal breath sounds.  Abdominal:     General: Bowel sounds are normal. There is no distension.     Palpations: Abdomen is soft.     Tenderness: There is abdominal tenderness.     Comments: Patient is mildly tender to palpation across the lower abdomen/suprapubic area.  Genitourinary:    Comments: Deferred with shared decision making. Self swab performed.  Musculoskeletal:     Comments: No tenderness to palpation to back.   Neurological:     General: No focal deficit present.     Mental Status: She is alert and oriented to person, place, and time. Mental status is at baseline.  Psychiatric:        Mood and Affect: Mood normal.        Behavior: Behavior normal.        Thought Content: Thought content normal.        Judgment: Judgment normal.      UC Treatments / Results  Labs (all labs ordered are listed, but only abnormal results are displayed) Labs Reviewed  POCT URINALYSIS DIP (MANUAL ENTRY) - Abnormal; Notable for the following components:      Result Value   Clarity, UA cloudy (*)    Blood, UA moderate (*)    Leukocytes, UA Trace (*)    All other components within normal limits  URINE CULTURE  POCT URINE PREGNANCY  CERVICOVAGINAL ANCILLARY ONLY    EKG   Radiology No results found.  Procedures Procedures (including critical care time)  Medications Ordered in UC Medications - No data to display  Initial Impression / Assessment and Plan / UC Course  I have reviewed the triage vital signs and the nursing notes.  Pertinent  labs & imaging results that were available during my care of the patient were reviewed by me and considered in my medical  decision making (see chart for details).     UA shows trace amount of leukocytes and some red blood cells.  Low suspicion for kidney stone given patient's physical exam so will defer imaging or emergent evaluation.  No suspicion for acute abdomen on exam.  There are trace leukocytes and with patient's associated symptoms, will opt to treat for UTI with cephalexin antibiotic.  Urine pregnancy test negative.  Cervicovaginal swab pending to rule out vaginitis.  Advised patient to follow-up with gynecologist for further evaluation and management in case IUD is etiology of symptoms.  Patient reports that she can still feel her string which is reassuring that it is still in place.  Advised strict ER precautions.  Patient verbalized understanding and was agreeable with plan. Final Clinical Impressions(s) / UC Diagnoses   Final diagnoses:  Acute cystitis with hematuria  Urinary frequency  Screening examination for venereal disease  Urine pregnancy test negative     Discharge Instructions      Urine pregnancy test was negative.  I am treating you for UTI with antibiotic.  Urine culture and vaginal swab are pending.  Will call if they are abnormal.  Please follow-up with gynecologist for your symptoms as well.      ED Prescriptions     Medication Sig Dispense Auth. Provider   cephALEXin (KEFLEX) 500 MG capsule Take 1 capsule (500 mg total) by mouth 2 (two) times daily for 7 days. 14 capsule [place], Acie Fredrickson, [place]      PDMP not reviewed this encounter.   Gustavus Bryant, [place] 12/24/22 1143

## 2022-12-25 LAB — CERVICOVAGINAL ANCILLARY ONLY
Bacterial Vaginitis (gardnerella): POSITIVE — AB
Candida Glabrata: NEGATIVE
Candida Vaginitis: POSITIVE — AB
Chlamydia: NEGATIVE
Comment: NEGATIVE
Comment: NEGATIVE
Comment: NEGATIVE
Comment: NEGATIVE
Comment: NEGATIVE
Comment: NORMAL
Neisseria Gonorrhea: NEGATIVE
Trichomonas: NEGATIVE

## 2022-12-25 LAB — URINE CULTURE: Culture: <10000 — AB

## 2022-12-30 ENCOUNTER — Telehealth (HOSPITAL_COMMUNITY): Payer: Self-pay | Admitting: Emergency Medicine

## 2022-12-30 MED ORDER — METRONIDAZOLE 500 MG PO TABS: 500.0000 mg | ORAL_TABLET | Freq: Two times a day (BID) | ORAL | 0 refills | Status: DC

## 2022-12-30 MED ORDER — FLUCONAZOLE 150 MG PO TABS: 150.0000 mg | ORAL_TABLET | Freq: Once | ORAL | 0 refills | Status: AC

## 2023-01-13 LAB — LAB REPORT - SCANNED
A1c: 5.4
EGFR: 107
HM HIV Screening: NEGATIVE
HM Pap smear: ABNORMAL

## 2023-01-23 ENCOUNTER — Encounter: Payer: Self-pay | Admitting: Certified Nurse Midwife

## 2023-01-23 ENCOUNTER — Telehealth: Payer: Self-pay | Admitting: Certified Nurse Midwife

## 2023-01-23 NOTE — Telephone Encounter (Signed)
Patient called this morning stated that she had an abnormal pap Smear.  The Pap Smear was done at [facility] by Dr. Wynelle Link, but Caren Griffins, FNP, knows all the details.  She also stated that she could fax [place] all the information about this pap smear.  She has an appointment on 02/24/2023, but is requesting a sooner appointment with you.  Please advise.

## 2023-01-27 ENCOUNTER — Other Ambulatory Visit (HOSPITAL_COMMUNITY)
Admission: RE | Admit: 2023-01-27 | Discharge: 2023-01-27 | Disposition: A | Discharge status: Home / Self Care | Payer: 59 | Source: Ambulatory Visit | Attending: Obstetrics and Gynecology | Admitting: Obstetrics and Gynecology

## 2023-01-27 ENCOUNTER — Encounter: Payer: Self-pay | Admitting: Obstetrics and Gynecology

## 2023-01-27 ENCOUNTER — Encounter: Payer: 59 | Admitting: Obstetrics and Gynecology

## 2023-01-27 ENCOUNTER — Ambulatory Visit (INDEPENDENT_AMBULATORY_CARE_PROVIDER_SITE_OTHER): Payer: 59 | Admitting: Obstetrics and Gynecology

## 2023-01-27 VITALS — BP 109/71 | HR 80 | Ht 66.0 in | Wt 209.9 lb

## 2023-01-27 DIAGNOSIS — R87612 Low grade squamous intraepithelial lesion on cytologic smear of cervix (LGSIL): Secondary | ICD-10-CM

## 2023-01-27 DIAGNOSIS — N870 Mild cervical dysplasia: Secondary | ICD-10-CM

## 2023-01-27 DIAGNOSIS — Z30431 Encounter for routine checking of intrauterine contraceptive device: Secondary | ICD-10-CM

## 2023-01-27 DIAGNOSIS — R102 Pelvic and perineal pain: Secondary | ICD-10-CM

## 2023-01-27 NOTE — Progress Notes (Signed)
HPI:  Virginia [name] is a 32 y.o.  [phone]  who presents today for evaluation and management of abnormal cervical cytology.    Dysplasia History:  LGSIL     HPV: Not done   In addition she complains of pelvic pain with intercourse and occasional morning right-sided pelvic pain.  She also states that she bleeds for more often than she would like with her IUD.  She originally got her IUD for heavy menstrual bleeding and cycle control.  (She has a tubal ligation for birth control).   ROS:  Pertinent items noted in HPI and remainder of comprehensive ROS otherwise negative.  OB History  Gravida Para Term Preterm AB Living  3 3 1 2   3   SAB IAB Ectopic Multiple Live Births          3    # Outcome Date GA Lbr Len/2nd Weight Sex Delivery Anes PTL Lv  3 Term 2016    M Vag-Spont   LIV  2 Preterm 2014    M Vag-Spont   LIV  1 Preterm 2011    F Vag-Spont   LIV    Past Medical History:  Diagnosis Date   ADHD (attention deficit hyperactivity disorder)    Asthma     Past Surgical History:  Procedure Laterality Date   TUBAL LIGATION  2016    SOCIAL HISTORY:  Social History   Substance and Sexual Activity  Alcohol Use Yes   Alcohol/week: 1.0 standard drink of alcohol   Types: 1 Glasses of wine per week   Comment: occas    Social History   Substance and Sexual Activity  Drug Use No     Family History  Problem Relation Age of Onset   Diabetes Maternal Grandmother    Hypertension Neg Hx    Thyroid disease Neg Hx    Cancer Neg Hx     ALLERGIES:  Patient has no known allergies.  She has a current medication list which includes the following prescription(s): amphetamine-dextroamphetamine.  Physical Exam: -Vitals:  BP 109/71   Pulse 80   Ht 5\' 6"  (1.676 m)   Wt 209 lb 14.4 oz (95.2 kg)   LMP 01/14/2023   BMI 33.88 kg/m   PROCEDURE: Colposcopy performed with 4% acetic acid after verbal consent obtained                            -Aceto-white Lesions  Location(s): See above  (large friable cervix)              -Biopsy performed at 7 and 1 o'clock               -ECC indicated and performed: No.     -Biopsy sites made hemostatic with pressure and Monsel's solution   -Satisfactory colposcopy: Yes.      -Evidence of Invasive cervical CA :  NO  ASSESSMENT:  Virginia [name] is a 32 y.o. [phone] here for  1. LGSIL on Pap smear of cervix   2. Pelvic pain   3. Encounter for management of intrauterine contraceptive device (IUD), unspecified IUD management type   .  PLAN: 1.  I discussed the grading system of pap smears and HPV high risk viral types.  We will discuss management after colpo results return. 2.  Ultrasound ordered   No orders of the defined types were placed in this encounter.  F/U  Return in about 2 weeks (around 02/10/2023).  Brennan Bailey ,MD 01/27/2023,3:31 PM

## 2023-01-27 NOTE — Progress Notes (Signed)
Patient presents today for colposcopy. Recent pap smear resulting in LGSIL cellularity, no hpv preformed. She states additional concerns regarding her IUD, cramping and pain during intercourse.

## 2023-02-02 LAB — SURGICAL PATHOLOGY

## 2023-02-03 ENCOUNTER — Ambulatory Visit (HOSPITAL_COMMUNITY)
Admission: RE | Admit: 2023-02-03 | Discharge: 2023-02-03 | Disposition: A | Discharge status: Home / Self Care | Payer: 59 | Source: Ambulatory Visit | Attending: Obstetrics and Gynecology | Admitting: Obstetrics and Gynecology

## 2023-02-03 DIAGNOSIS — R102 Pelvic and perineal pain: Secondary | ICD-10-CM | POA: Diagnosis not present

## 2023-02-03 DIAGNOSIS — Z30431 Encounter for routine checking of intrauterine contraceptive device: Secondary | ICD-10-CM | POA: Insufficient documentation

## 2023-02-05 ENCOUNTER — Encounter: Payer: Self-pay | Admitting: Obstetrics and Gynecology

## 2023-02-10 ENCOUNTER — Telehealth (INDEPENDENT_AMBULATORY_CARE_PROVIDER_SITE_OTHER): Payer: 59 | Admitting: Obstetrics and Gynecology

## 2023-02-10 ENCOUNTER — Encounter: Payer: Self-pay | Admitting: Obstetrics and Gynecology

## 2023-02-10 DIAGNOSIS — R102 Pelvic and perineal pain: Secondary | ICD-10-CM

## 2023-02-10 DIAGNOSIS — N938 Other specified abnormal uterine and vaginal bleeding: Secondary | ICD-10-CM | POA: Diagnosis not present

## 2023-02-10 DIAGNOSIS — N870 Mild cervical dysplasia: Secondary | ICD-10-CM | POA: Diagnosis not present

## 2023-02-10 MED ORDER — MYFEMBREE 40-1-0.5 MG PO TABS
1.0000 | ORAL_TABLET | Freq: Every day | ORAL | 3 refills | Status: DC
Start: 2023-02-10 — End: 2023-02-24

## 2023-02-10 NOTE — Progress Notes (Signed)
Virtual Visit via Video Note  I connected with Deolinda Frid on 02/10/23 at  7:35 AM EDT by video and verified that I was speaking with the correct person using two identifiers.    Ms. Angelina Venard is a 32 y.o. [phone] who LMP was Patient's last menstrual period was 01/14/2023. I discussed the limitations, risks, security and privacy concerns of performing an evaluation and management service by video and the availability of in person appointments. I also discussed with the patient that there may be a patient responsible charge related to this service. The patient expressed understanding and agreed to proceed.  Location of patient: Car  Patient gave explicit verbal consent for video visit:  YES  Location of provider:  [facility] office  Persons other than physician and patient involved in provider conference:  None   Subjective:   History of Present Illness:    Patient has a history of LGSIL on Pap.  She underwent colposcopy with directed biopsies.  In addition she has an IUD in place and a tubal ligation for birth control.  The IUD was placed to try to control her bleeding.  This has not worked well.  She continues to have bleeding after intercourse, pelvic pain, and generally irregular bleeding sometimes for 2 to 3 weeks/month. Patient is reconsidering her tubal ligation and perhaps would like another child in the future. Her recent ultrasound shows no pelvic pathology and the IUD is correctly located.  Hx: The following portions of the patient's history were reviewed and updated as appropriate:             She  has a past medical history of ADHD (attention deficit hyperactivity disorder) and Asthma. She does not have any pertinent problems on file. She  has a past surgical history that includes Tubal ligation (2016). Her family history includes Diabetes in her maternal grandmother. She  reports that she has never smoked. She has never used smokeless tobacco. She reports current  alcohol use of about 1.0 standard drink of alcohol per week. She reports that she does not use drugs. She has a current medication list which includes the following prescription(s): myfembree and amphetamine-dextroamphetamine. She has No Known Allergies.       Review of Systems:  Review of Systems  Constitutional: Denied constitutional symptoms, night sweats, recent illness, fatigue, fever, insomnia and weight loss.  Eyes: Denied eye symptoms, eye pain, photophobia, vision change and visual disturbance.  Ears/Nose/Throat/Neck: Denied ear, nose, throat or neck symptoms, hearing loss, nasal discharge, sinus congestion and sore throat.  Cardiovascular: Denied cardiovascular symptoms, arrhythmia, chest pain/pressure, edema, exercise intolerance, orthopnea and palpitations.  Respiratory: Denied pulmonary symptoms, asthma, pleuritic pain, productive sputum, cough, dyspnea and wheezing.  Gastrointestinal: Denied, gastro-esophageal reflux, melena, nausea and vomiting.  Genitourinary: Denied genitourinary symptoms including symptomatic vaginal discharge, pelvic relaxation issues, and urinary complaints.  Musculoskeletal: Denied musculoskeletal symptoms, stiffness, swelling, muscle weakness and myalgia.  Dermatologic: Denied dermatology symptoms, rash and scar.  Neurologic: Denied neurology symptoms, dizziness, headache, neck pain and syncope.  Psychiatric: Denied psychiatric symptoms, anxiety and depression.  Endocrine: Denied endocrine symptoms including hot flashes and night sweats.   Meds:   Current Outpatient Medications on File Prior to Visit  Medication Sig Dispense Refill   amphetamine-dextroamphetamine (ADDERALL XR) 10 MG 24 hr capsule Take 10 mg by mouth in the morning and at bedtime.     No current facility-administered medications on file prior to visit.    Assessment:    [id number] Patient Active  Problem List   Diagnosis Date Noted   Obesity (BMI 30-39.9) 12/04/2021     1. Pelvic  pain   2. Dysplasia of cervix, low grade (CIN 1)   3. DUB (dysfunctional uterine bleeding)     Not sure why she is having irregular heavy bleeding.  Perhaps she has adenomyosis/endometriosis.  CIN-1 is consistent with her LGSIL Pap.  Plan:            1.  We discussed multiple options for her irregular bleeding.  As she does not want a hysterectomy at this time we must consider other options.  We have discussed the possibility of adenomyosis and use of Myfembree for heavy bleeding.  She is willing to try this.-Prescription sent.  2.  We discussed the natural course and history of HPV and its relationship to CIN.  We have discussed CIN and its relationship to cervical cancer.  Per ASCCP guidelines I have recommended follow-up Pap in 1 year.  All of her questions were answered.  Orders No orders of the defined types were placed in this encounter.   No orders of the defined types were placed in this encounter.     F/U  No follow-ups on file. I spent 26 minutes involved in the care of this patient preparing to see the patient by obtaining and reviewing her medical history (including labs, imaging tests and prior procedures), documenting clinical information in the electronic health record (EHR), counseling and coordinating care plans, writing and sending prescriptions, ordering tests or procedures and in direct communicating with the patient and medical staff discussing pertinent items from her history and physical exam.   Elonda Husky, M.D. 02/10/2023 7:54 AM

## 2023-02-24 ENCOUNTER — Encounter: Payer: Self-pay | Admitting: Obstetrics and Gynecology

## 2023-02-24 ENCOUNTER — Ambulatory Visit: Payer: 59 | Admitting: Obstetrics and Gynecology

## 2023-02-24 VITALS — BP 125/68 | HR 81 | Resp 16 | Ht 64.0 in | Wt 215.3 lb

## 2023-02-24 DIAGNOSIS — R102 Pelvic and perineal pain: Secondary | ICD-10-CM | POA: Diagnosis not present

## 2023-02-24 DIAGNOSIS — N921 Excessive and frequent menstruation with irregular cycle: Secondary | ICD-10-CM | POA: Diagnosis not present

## 2023-02-24 DIAGNOSIS — Z975 Presence of (intrauterine) contraceptive device: Secondary | ICD-10-CM

## 2023-02-24 DIAGNOSIS — Z30432 Encounter for removal of intrauterine contraceptive device: Secondary | ICD-10-CM | POA: Diagnosis not present

## 2023-02-24 MED ORDER — TRANEXAMIC ACID 650 MG PO TABS: 1300.0000 mg | ORAL_TABLET | Freq: Three times a day (TID) | ORAL | 2 refills | Status: AC

## 2023-02-24 NOTE — Progress Notes (Signed)
GYNECOLOGY PROGRESS NOTE  Subjective:    Patient ID: Virginia [name], female    DOB: 19-Dec-1990, 32 y.o.   MRN: [id number]  HPI  Patient is a 32 y.o. [phone] female who presents for management of menorrhagia.  Patient currently has a Mirena IUD in place, was inserted slightly over 1 year ago in  May of 2023. She has had cramping and abnormal bleeding since that time.  That she got the IUD secondary to her cycles being very heavy but does not feel like the IUD has been helpful and actually feels as though her symptoms have become worse especially with abdominal cramping and now more irregularity of her cycles. She also recently had an abnormal Pap smear (LGSIL) followed by colposcopy which revealed CIN-1.  Patient is concerned that the IUD may have had a role in this as well as she has never had an abnormal Pap smear prior to now.  Lastly, in May patient also had vaginitis with BV and yeast, notes that this is also not something that she typically experiences.  Would like her IUD removed today if possible.  Patient does have a history of tubal ligation.  Notes that she was seen approximately 2 months ago by Dr. Brennan Bailey of the practice who recommended for patient to consider hysterectomy however she notes that she is nowhere near desiring surgical intervention at this time.  Wonders what other options may be available for her.   The following portions of the patient's history were reviewed and updated as appropriate: allergies, current medications, past family history, past medical history, past social history, past surgical history, and problem list.  Review of Systems Pertinent items noted in HPI and remainder of comprehensive ROS otherwise negative.   Objective:  Blood pressure 125/68, pulse 81, resp. rate 16, height 5\' 4"  (1.626 m), weight 215 lb 4.8 oz (97.7 kg), last menstrual period 01/14/2023.  General appearance: alert and no distress Abdomen: soft, non-tender; bowel sounds normal;  no masses,  no organomegaly Pelvic: external genitalia normal, rectovaginal septum normal.  Vagina without discharge.  Cervix normal appearing, no lesions and no motion tenderness.  Uterus mobile, nontender, normal shape and size.  Adnexae non-palpable, nontender bilaterally.  Extremities: extremities normal, atraumatic, no cyanosis or edema Neurologic: Grossly normal   Assessment:   1. Breakthrough bleeding associated with intrauterine device (IUD)   2. Pelvic pain   3. Menorrhagia with irregular cycle   4. IUD (intrauterine device) in place      Plan:   IUD removed today (se procedure note below).  Discussed alternative options for management of her cycles if they continue to be heavy and irregular. Discussed other birth control options or consideration of non-hormonal options such as TXA.  Patient desires to wait and see what her cycles do over the next year, however is open to trying TXA if cycles worsen over the next few months.  Return to clinic for any scheduled appointments or for any gynecologic concerns as needed.      GYNECOLOGY OFFICE PROCEDURE NOTE  IUD Removal  Patient identified, informed consent performed, consent signed.   Chaperone present.  Patient was placed in the dorsal lithotomy position, normal external genitalia was noted.  A speculum was placed in the patient's vagina, normal discharge was noted, no lesions. The cervix was visualized, no lesions, no abnormal discharge.  The strings of the IUD were grasped and pulled using ring forceps. The IUD was removed in its entirety.  Patient tolerated the  procedure well.    Patient has a history of BTL for contraception.  Routine preventative health maintenance measures emphasized.   A total of 25 minutes were spent face-to-face with the patient during this encounter and over half of that time dealt with counseling and coordination of care.   Hildred Laser, MD [facility]

## 2023-03-03 ENCOUNTER — Encounter: Payer: Self-pay | Admitting: Obstetrics and Gynecology

## 2024-02-29 ENCOUNTER — Ambulatory Visit: Admitting: Physician Assistant
